# Patient Record
Sex: Male | Born: 1935 | Race: White | Hispanic: No | Marital: Married | Smoking: Former smoker
Health system: Southern US, Community
[De-identification: ages and names within clinical notes are randomized; demographics above are authoritative.]

## PROBLEM LIST (undated history)

## (undated) DIAGNOSIS — T8859XA Other complications of anesthesia, initial encounter: Secondary | ICD-10-CM

## (undated) DIAGNOSIS — Z950 Presence of cardiac pacemaker: Secondary | ICD-10-CM

## (undated) DIAGNOSIS — C61 Malignant neoplasm of prostate: Secondary | ICD-10-CM

## (undated) DIAGNOSIS — E785 Hyperlipidemia, unspecified: Secondary | ICD-10-CM

## (undated) DIAGNOSIS — I4510 Unspecified right bundle-branch block: Secondary | ICD-10-CM

## (undated) DIAGNOSIS — J449 Chronic obstructive pulmonary disease, unspecified: Secondary | ICD-10-CM

## (undated) DIAGNOSIS — E039 Hypothyroidism, unspecified: Secondary | ICD-10-CM

## (undated) DIAGNOSIS — T4145XA Adverse effect of unspecified anesthetic, initial encounter: Secondary | ICD-10-CM

## (undated) DIAGNOSIS — Z8619 Personal history of other infectious and parasitic diseases: Secondary | ICD-10-CM

## (undated) DIAGNOSIS — C4491 Basal cell carcinoma of skin, unspecified: Secondary | ICD-10-CM

## (undated) DIAGNOSIS — I10 Essential (primary) hypertension: Secondary | ICD-10-CM

## (undated) DIAGNOSIS — K649 Unspecified hemorrhoids: Secondary | ICD-10-CM

## (undated) DIAGNOSIS — Z974 Presence of external hearing-aid: Secondary | ICD-10-CM

## (undated) DIAGNOSIS — Z87442 Personal history of urinary calculi: Secondary | ICD-10-CM

## (undated) HISTORY — PX: HERNIA REPAIR: SHX51

## (undated) HISTORY — PX: PROSTATE SURGERY: SHX751

## (undated) HISTORY — PX: CATARACT EXTRACTION W/ INTRAOCULAR LENS  IMPLANT, BILATERAL: SHX1307

## (undated) HISTORY — PX: TONSILLECTOMY AND ADENOIDECTOMY: SUR1326

---

## 2005-07-09 ENCOUNTER — Inpatient Hospital Stay: Payer: Self-pay | Admitting: Cardiology

## 2005-07-09 ENCOUNTER — Other Ambulatory Visit: Payer: Self-pay

## 2005-07-10 ENCOUNTER — Other Ambulatory Visit: Payer: Self-pay

## 2005-07-11 ENCOUNTER — Other Ambulatory Visit: Payer: Self-pay

## 2008-09-04 ENCOUNTER — Ambulatory Visit: Payer: Self-pay | Admitting: Gastroenterology

## 2009-08-13 ENCOUNTER — Ambulatory Visit: Payer: Self-pay | Admitting: Ophthalmology

## 2010-05-20 ENCOUNTER — Ambulatory Visit: Payer: Self-pay | Admitting: Ophthalmology

## 2014-02-23 ENCOUNTER — Ambulatory Visit: Payer: Self-pay | Admitting: Family Medicine

## 2014-03-09 ENCOUNTER — Ambulatory Visit: Payer: Self-pay | Admitting: Family Medicine

## 2014-03-22 ENCOUNTER — Ambulatory Visit: Payer: Self-pay | Admitting: Cardiothoracic Surgery

## 2014-03-29 ENCOUNTER — Ambulatory Visit: Payer: Self-pay | Admitting: Cardiothoracic Surgery

## 2014-08-09 DIAGNOSIS — I10 Essential (primary) hypertension: Secondary | ICD-10-CM | POA: Insufficient documentation

## 2014-08-09 DIAGNOSIS — Z950 Presence of cardiac pacemaker: Secondary | ICD-10-CM | POA: Insufficient documentation

## 2014-08-09 DIAGNOSIS — E7800 Pure hypercholesterolemia, unspecified: Secondary | ICD-10-CM | POA: Insufficient documentation

## 2014-08-13 ENCOUNTER — Ambulatory Visit: Payer: Self-pay | Admitting: Cardiology

## 2014-08-16 ENCOUNTER — Ambulatory Visit: Payer: Self-pay | Admitting: Cardiology

## 2014-08-16 DIAGNOSIS — Z950 Presence of cardiac pacemaker: Secondary | ICD-10-CM

## 2014-08-16 HISTORY — DX: Presence of cardiac pacemaker: Z95.0

## 2014-08-16 HISTORY — PX: INSERT / REPLACE / REMOVE PACEMAKER: SUR710

## 2014-09-20 ENCOUNTER — Ambulatory Visit: Payer: Self-pay | Admitting: Cardiothoracic Surgery

## 2014-09-27 ENCOUNTER — Ambulatory Visit
Admit: 2014-09-27 | Disposition: A | Discharge status: Home / Self Care | Payer: Self-pay | Attending: Cardiothoracic Surgery | Admitting: Cardiothoracic Surgery

## 2014-09-28 ENCOUNTER — Ambulatory Visit
Admit: 2014-09-28 | Disposition: A | Discharge status: Home / Self Care | Payer: Self-pay | Attending: Cardiothoracic Surgery | Admitting: Cardiothoracic Surgery

## 2014-10-04 DIAGNOSIS — E039 Hypothyroidism, unspecified: Secondary | ICD-10-CM | POA: Insufficient documentation

## 2014-10-28 NOTE — Op Note (Signed)
PATIENT NAME:  Danny Phillips, Danny Phillips MR#:  [id number] DATE OF BIRTH:  1928/11/01  DATE OF PROCEDURE:  08/16/2014  PRIMARY CARE PHYSICIAN: Juluis Pitch, MD  PREPROCEDURE DIAGNOSES:  1.  Complete heart block. 2.  Elective replacement indication.   PROCEDURE: Dual chamber pacemaker generator change-out.  POSTPROCEDURE DIAGNOSIS: Atrial sensing with ventricular pacing.   INDICATION: The patient is an 81 year old gentleman who is status post dual-chamber pacemaker 07/10/2005 for complete heart block. Recent pacemaker interrogation revealed that the pacemaker generator was at elective replacement indication. The procedure, the risks, benefits, and alternatives of pacemaker generator change-out were explained to the patient and informed written consent was obtained.   DESCRIPTION OF PROCEDURE: He was brought to the operating room in a fasting state. The left pectoral region was prepped and draped in the usual sterile manner. Anesthesia was obtained with 1% Xylocaine locally. A 6 cm incision was performed over the left pectoral region. The old pacemaker generator was retrieved by electrocautery and blunt dissection. The leads were disconnected from the old pacemaker generator and connected to a new dual-chamber rate responsive pacemaker generator (Medtronic Adapta [id number]). The pacemaker pocket was irrigated with gentamicin solution. The new pacemaker generator was positioned into the pocket. The pocket was closed with 2-0 and 4-0 Vicryl, respectively. Steri-Strips and a pressure dressing were applied.   ____________________________ Isaias Cowman, MD ap:sb D: 08/16/2014 13:11:34 ET T: 08/16/2014 13:43:09 ET JOB#: [id number]  cc: Isaias Cowman, MD, <Dictator> Isaias Cowman MD ELECTRONICALLY SIGNED 08/21/2014 14:37

## 2014-11-29 DIAGNOSIS — R0681 Apnea, not elsewhere classified: Secondary | ICD-10-CM | POA: Insufficient documentation

## 2015-04-17 DIAGNOSIS — E785 Hyperlipidemia, unspecified: Secondary | ICD-10-CM | POA: Insufficient documentation

## 2015-04-17 DIAGNOSIS — K219 Gastro-esophageal reflux disease without esophagitis: Secondary | ICD-10-CM | POA: Insufficient documentation

## 2015-08-06 DIAGNOSIS — I495 Sick sinus syndrome: Secondary | ICD-10-CM | POA: Diagnosis not present

## 2015-08-08 DIAGNOSIS — L821 Other seborrheic keratosis: Secondary | ICD-10-CM | POA: Diagnosis not present

## 2015-08-08 DIAGNOSIS — L820 Inflamed seborrheic keratosis: Secondary | ICD-10-CM | POA: Diagnosis not present

## 2015-08-08 DIAGNOSIS — D229 Melanocytic nevi, unspecified: Secondary | ICD-10-CM | POA: Diagnosis not present

## 2015-08-08 DIAGNOSIS — L300 Nummular dermatitis: Secondary | ICD-10-CM | POA: Diagnosis not present

## 2015-08-08 DIAGNOSIS — L720 Epidermal cyst: Secondary | ICD-10-CM | POA: Diagnosis not present

## 2015-09-02 DIAGNOSIS — I10 Essential (primary) hypertension: Secondary | ICD-10-CM | POA: Diagnosis not present

## 2015-09-02 DIAGNOSIS — J069 Acute upper respiratory infection, unspecified: Secondary | ICD-10-CM | POA: Diagnosis not present

## 2015-09-11 DIAGNOSIS — L300 Nummular dermatitis: Secondary | ICD-10-CM | POA: Diagnosis not present

## 2015-09-11 DIAGNOSIS — L280 Lichen simplex chronicus: Secondary | ICD-10-CM | POA: Diagnosis not present

## 2015-09-16 ENCOUNTER — Telehealth: Payer: Self-pay | Admitting: Cardiothoracic Surgery

## 2015-09-16 DIAGNOSIS — R509 Fever, unspecified: Secondary | ICD-10-CM | POA: Diagnosis not present

## 2015-09-16 DIAGNOSIS — R6883 Chills (without fever): Secondary | ICD-10-CM | POA: Diagnosis not present

## 2015-09-16 DIAGNOSIS — R05 Cough: Secondary | ICD-10-CM | POA: Diagnosis not present

## 2015-09-16 DIAGNOSIS — J189 Pneumonia, unspecified organism: Secondary | ICD-10-CM | POA: Diagnosis not present

## 2015-09-16 NOTE — Telephone Encounter (Signed)
Patient has appt w/[facility] on 09[facility]. She said he usually has a chest scan prior to seeing him but they have not received any info about appt for scan. No orders currently entered. Please advise. [phone].

## 2015-09-24 DIAGNOSIS — R062 Wheezing: Secondary | ICD-10-CM | POA: Diagnosis not present

## 2015-09-26 ENCOUNTER — Inpatient Hospital Stay: Payer: PPO | Admitting: Cardiothoracic Surgery

## 2015-09-26 DIAGNOSIS — R911 Solitary pulmonary nodule: Secondary | ICD-10-CM | POA: Insufficient documentation

## 2015-09-26 DIAGNOSIS — E7800 Pure hypercholesterolemia, unspecified: Secondary | ICD-10-CM | POA: Diagnosis not present

## 2015-09-26 DIAGNOSIS — R0602 Shortness of breath: Secondary | ICD-10-CM | POA: Diagnosis not present

## 2015-09-26 DIAGNOSIS — Z950 Presence of cardiac pacemaker: Secondary | ICD-10-CM | POA: Diagnosis not present

## 2015-09-26 DIAGNOSIS — I10 Essential (primary) hypertension: Secondary | ICD-10-CM | POA: Diagnosis not present

## 2015-10-03 ENCOUNTER — Ambulatory Visit
Admission: RE | Admit: 2015-10-03 | Discharge: 2015-10-03 | Disposition: A | Discharge status: Home / Self Care | Payer: PPO | Source: Ambulatory Visit | Attending: Cardiothoracic Surgery | Admitting: Cardiothoracic Surgery

## 2015-10-03 ENCOUNTER — Inpatient Hospital Stay: Payer: PPO | Attending: Cardiothoracic Surgery | Admitting: Cardiothoracic Surgery

## 2015-10-03 VITALS — BP 145/87 | HR 78 | Temp 96.4°F | Ht 67.0 in | Wt 158.3 lb

## 2015-10-03 DIAGNOSIS — R918 Other nonspecific abnormal finding of lung field: Secondary | ICD-10-CM | POA: Diagnosis not present

## 2015-10-03 DIAGNOSIS — R05 Cough: Secondary | ICD-10-CM | POA: Insufficient documentation

## 2015-10-03 DIAGNOSIS — R911 Solitary pulmonary nodule: Secondary | ICD-10-CM | POA: Diagnosis not present

## 2015-10-03 NOTE — Progress Notes (Signed)
Danny Phillips Inpatient Post-Op Note  Patient ID: Danny Phillips., male   DOB: December 05, 1928, 82 y.o.   MRN: [id number]  HISTORY: [name] returns today in follow-up. He states that he was treated for pneumonia several weeks ago and his symptoms have now resolved. Mostly he was complaining of some cough with a small amount of sputum. A chest x-ray was obtained in March which did not reveal any obvious mass. He did have a CT scan performed today which I have independently reviewed. He has no other intercurrent problems.   Filed Vitals:   10/03/15 1123  BP: 145/87  Pulse: 78  Temp: 96.4 F (35.8 C)     EXAM: Resp: Lungs are clear bilaterally.  No respiratory distress, normal effort. Heart:  Regular without murmurs Abd:  Abdomen is soft, non distended and non tender. No masses are palpable.  There is no rebound and no guarding.  Neurological: Alert and oriented to person, place, and time. Coordination normal.  Skin: Skin is warm and dry. No rash noted. No diaphoretic. No erythema. No pallor.  Psychiatric: Normal mood and affect. Normal behavior. Judgment and thought content normal.   There is a well-healed scar in his left infraclavicular region.  ASSESSMENT: I have independently reviewed the patient's CT scan. There is a stable 6 mm nodule in the right lower lobe as well as other scattered calcified pulmonary nodules. This is likely benign disease and I would like to see the patient back again in one year for a final CT.   PLAN:   We will see the patient back again in one year with another CT scan of the chest.    Nestor Lewandowsky, MD

## 2015-10-07 NOTE — Progress Notes (Signed)
Danny Phillips Inpatient Post-Op Note  Patient ID: Danny Lamendola., male   DOB: September 29, 1928, 82 y.o.   MRN: [id number]  HISTORY: No new problems.  Not short of breath.     There were no vitals filed for this visit.   EXAM: Resp: Lungs are clear bilaterally.  No respiratory distress, normal effort. Heart:  Regular without murmurs Abd:  Abdomen is soft, non distended and non tender. No masses are palpable.  There is no rebound and no guarding.  Neurological: Alert and oriented to person, place, and time. Coordination normal.  Skin: Skin is warm and dry. No rash noted. No diaphoretic. No erythema. No pallor.  Psychiatric: Normal mood and affect. Normal behavior. Judgment and thought content normal.    ASSESSMENT: Independent review of CT of chest shows stable nodule in the RLL most likely benign as it has been stable for over a year   PLAN:   Will see in one year with a CT of chest.  Last one if nodule is stable.      Nestor Lewandowsky, MD

## 2015-10-17 DIAGNOSIS — I10 Essential (primary) hypertension: Secondary | ICD-10-CM | POA: Diagnosis not present

## 2015-10-17 DIAGNOSIS — Z0000 Encounter for general adult medical examination without abnormal findings: Secondary | ICD-10-CM | POA: Diagnosis not present

## 2015-10-17 DIAGNOSIS — E7800 Pure hypercholesterolemia, unspecified: Secondary | ICD-10-CM | POA: Diagnosis not present

## 2015-10-17 DIAGNOSIS — K219 Gastro-esophageal reflux disease without esophagitis: Secondary | ICD-10-CM | POA: Diagnosis not present

## 2015-10-17 DIAGNOSIS — E039 Hypothyroidism, unspecified: Secondary | ICD-10-CM | POA: Diagnosis not present

## 2015-10-17 DIAGNOSIS — Z8546 Personal history of malignant neoplasm of prostate: Secondary | ICD-10-CM | POA: Diagnosis not present

## 2015-11-11 DIAGNOSIS — D72829 Elevated white blood cell count, unspecified: Secondary | ICD-10-CM | POA: Diagnosis not present

## 2015-11-11 DIAGNOSIS — J441 Chronic obstructive pulmonary disease with (acute) exacerbation: Secondary | ICD-10-CM | POA: Diagnosis not present

## 2015-11-11 DIAGNOSIS — J209 Acute bronchitis, unspecified: Secondary | ICD-10-CM | POA: Diagnosis not present

## 2015-11-11 DIAGNOSIS — N289 Disorder of kidney and ureter, unspecified: Secondary | ICD-10-CM | POA: Diagnosis not present

## 2015-11-11 DIAGNOSIS — J42 Unspecified chronic bronchitis: Secondary | ICD-10-CM | POA: Diagnosis not present

## 2015-11-18 DIAGNOSIS — R0902 Hypoxemia: Secondary | ICD-10-CM | POA: Diagnosis not present

## 2015-11-18 DIAGNOSIS — J9801 Acute bronchospasm: Secondary | ICD-10-CM | POA: Diagnosis not present

## 2015-11-18 DIAGNOSIS — J449 Chronic obstructive pulmonary disease, unspecified: Secondary | ICD-10-CM | POA: Diagnosis not present

## 2015-11-18 DIAGNOSIS — R0602 Shortness of breath: Secondary | ICD-10-CM | POA: Diagnosis not present

## 2015-12-03 DIAGNOSIS — R0602 Shortness of breath: Secondary | ICD-10-CM | POA: Diagnosis not present

## 2015-12-03 DIAGNOSIS — J449 Chronic obstructive pulmonary disease, unspecified: Secondary | ICD-10-CM | POA: Diagnosis not present

## 2016-01-31 DIAGNOSIS — J0100 Acute maxillary sinusitis, unspecified: Secondary | ICD-10-CM | POA: Diagnosis not present

## 2016-02-04 DIAGNOSIS — I495 Sick sinus syndrome: Secondary | ICD-10-CM | POA: Diagnosis not present

## 2016-04-02 DIAGNOSIS — E7800 Pure hypercholesterolemia, unspecified: Secondary | ICD-10-CM | POA: Diagnosis not present

## 2016-04-02 DIAGNOSIS — Z950 Presence of cardiac pacemaker: Secondary | ICD-10-CM | POA: Diagnosis not present

## 2016-04-02 DIAGNOSIS — I10 Essential (primary) hypertension: Secondary | ICD-10-CM | POA: Diagnosis not present

## 2016-04-02 DIAGNOSIS — R0602 Shortness of breath: Secondary | ICD-10-CM | POA: Diagnosis not present

## 2016-04-06 DIAGNOSIS — R911 Solitary pulmonary nodule: Secondary | ICD-10-CM | POA: Diagnosis not present

## 2016-04-06 DIAGNOSIS — R0602 Shortness of breath: Secondary | ICD-10-CM | POA: Diagnosis not present

## 2016-04-06 DIAGNOSIS — K219 Gastro-esophageal reflux disease without esophagitis: Secondary | ICD-10-CM | POA: Diagnosis not present

## 2016-04-06 DIAGNOSIS — J310 Chronic rhinitis: Secondary | ICD-10-CM | POA: Diagnosis not present

## 2016-04-06 DIAGNOSIS — J449 Chronic obstructive pulmonary disease, unspecified: Secondary | ICD-10-CM | POA: Diagnosis not present

## 2016-04-17 DIAGNOSIS — E039 Hypothyroidism, unspecified: Secondary | ICD-10-CM | POA: Diagnosis not present

## 2016-04-17 DIAGNOSIS — I10 Essential (primary) hypertension: Secondary | ICD-10-CM | POA: Diagnosis not present

## 2016-04-17 DIAGNOSIS — R0602 Shortness of breath: Secondary | ICD-10-CM | POA: Diagnosis not present

## 2016-04-17 DIAGNOSIS — J42 Unspecified chronic bronchitis: Secondary | ICD-10-CM | POA: Insufficient documentation

## 2016-04-17 DIAGNOSIS — K219 Gastro-esophageal reflux disease without esophagitis: Secondary | ICD-10-CM | POA: Diagnosis not present

## 2016-04-17 DIAGNOSIS — I499 Cardiac arrhythmia, unspecified: Secondary | ICD-10-CM | POA: Diagnosis not present

## 2016-04-17 DIAGNOSIS — E7800 Pure hypercholesterolemia, unspecified: Secondary | ICD-10-CM | POA: Diagnosis not present

## 2016-04-20 DIAGNOSIS — R0602 Shortness of breath: Secondary | ICD-10-CM | POA: Diagnosis not present

## 2016-06-08 DIAGNOSIS — J320 Chronic maxillary sinusitis: Secondary | ICD-10-CM | POA: Diagnosis not present

## 2016-06-30 DIAGNOSIS — J329 Chronic sinusitis, unspecified: Secondary | ICD-10-CM | POA: Diagnosis not present

## 2016-06-30 DIAGNOSIS — J309 Allergic rhinitis, unspecified: Secondary | ICD-10-CM | POA: Diagnosis not present

## 2016-07-22 DIAGNOSIS — R51 Headache: Secondary | ICD-10-CM | POA: Diagnosis not present

## 2016-07-22 DIAGNOSIS — J324 Chronic pansinusitis: Secondary | ICD-10-CM | POA: Diagnosis not present

## 2016-08-04 DIAGNOSIS — I495 Sick sinus syndrome: Secondary | ICD-10-CM | POA: Diagnosis not present

## 2016-09-07 DIAGNOSIS — R05 Cough: Secondary | ICD-10-CM | POA: Diagnosis not present

## 2016-09-07 DIAGNOSIS — R0602 Shortness of breath: Secondary | ICD-10-CM | POA: Diagnosis not present

## 2016-09-07 DIAGNOSIS — R911 Solitary pulmonary nodule: Secondary | ICD-10-CM | POA: Diagnosis not present

## 2016-09-16 DIAGNOSIS — R51 Headache: Secondary | ICD-10-CM | POA: Diagnosis not present

## 2016-09-16 DIAGNOSIS — J324 Chronic pansinusitis: Secondary | ICD-10-CM | POA: Diagnosis not present

## 2016-09-16 DIAGNOSIS — J309 Allergic rhinitis, unspecified: Secondary | ICD-10-CM | POA: Diagnosis not present

## 2016-09-29 ENCOUNTER — Telehealth: Payer: Self-pay

## 2016-09-29 DIAGNOSIS — E7800 Pure hypercholesterolemia, unspecified: Secondary | ICD-10-CM | POA: Diagnosis not present

## 2016-09-29 DIAGNOSIS — I10 Essential (primary) hypertension: Secondary | ICD-10-CM | POA: Diagnosis not present

## 2016-09-29 DIAGNOSIS — R0602 Shortness of breath: Secondary | ICD-10-CM | POA: Diagnosis not present

## 2016-09-29 DIAGNOSIS — Z950 Presence of cardiac pacemaker: Secondary | ICD-10-CM | POA: Diagnosis not present

## 2016-09-29 NOTE — Telephone Encounter (Signed)
Monica from [employer] [facility] called said she received two request for a prior auth. one was sent over as a STAT other was sent on fax Brayton Layman was asking if it was for additional clinic or just another request. Brayton Layman can be reached at [phone].

## 2016-09-29 NOTE — Telephone Encounter (Signed)
I spoke with [facility] at this time. She stated she received two request for the same CT Chest and I told her to cancel my request due to the stat order placed from hospital. I will call tomorrow to get authorization number for our records.

## 2016-09-29 NOTE — Telephone Encounter (Signed)
Authorization faxed to [facility] for authorization of CT chest at this time.Fax Number- [phone]

## 2016-09-29 NOTE — Telephone Encounter (Signed)
Patient is having a CT scan on 10/01/16. The CT requires authorization and needs to be authorized today by 2:00 PM.   If you have any questions you can contact Tillie Rung at [facility] P#: [phone] ext [phone]

## 2016-09-30 ENCOUNTER — Telehealth: Payer: Self-pay

## 2016-09-30 ENCOUNTER — Other Ambulatory Visit: Payer: Self-pay

## 2016-09-30 NOTE — Telephone Encounter (Signed)
Spoke with Paulita Cradle @ [place] and CPT code [phone] is approved- Authorization [id number].  Please see previous notes/calls regarding request for CT scan.

## 2016-10-01 ENCOUNTER — Ambulatory Visit: Payer: PPO | Admitting: Cardiothoracic Surgery

## 2016-10-01 ENCOUNTER — Ambulatory Visit
Admission: RE | Admit: 2016-10-01 | Discharge: 2016-10-01 | Disposition: A | Discharge status: Home / Self Care | Payer: PPO | Source: Ambulatory Visit | Attending: Cardiothoracic Surgery | Admitting: Cardiothoracic Surgery

## 2016-10-01 ENCOUNTER — Other Ambulatory Visit: Payer: Self-pay

## 2016-10-01 DIAGNOSIS — R918 Other nonspecific abnormal finding of lung field: Secondary | ICD-10-CM | POA: Diagnosis not present

## 2016-10-01 DIAGNOSIS — D71 Functional disorders of polymorphonuclear neutrophils: Secondary | ICD-10-CM | POA: Diagnosis not present

## 2016-10-01 DIAGNOSIS — I700 Atherosclerosis of aorta: Secondary | ICD-10-CM | POA: Diagnosis not present

## 2016-10-01 DIAGNOSIS — I2510 Atherosclerotic heart disease of native coronary artery without angina pectoris: Secondary | ICD-10-CM | POA: Diagnosis not present

## 2016-10-01 DIAGNOSIS — R911 Solitary pulmonary nodule: Secondary | ICD-10-CM | POA: Diagnosis not present

## 2016-10-02 ENCOUNTER — Encounter: Payer: Self-pay | Admitting: Cardiothoracic Surgery

## 2016-10-02 ENCOUNTER — Ambulatory Visit (INDEPENDENT_AMBULATORY_CARE_PROVIDER_SITE_OTHER): Payer: PPO | Admitting: Cardiothoracic Surgery

## 2016-10-02 ENCOUNTER — Encounter: Payer: Self-pay | Admitting: *Deleted

## 2016-10-02 VITALS — BP 159/60 | HR 84 | Temp 97.0°F | Resp 15 | Ht 67.0 in | Wt 156.0 lb

## 2016-10-02 DIAGNOSIS — R911 Solitary pulmonary nodule: Secondary | ICD-10-CM | POA: Diagnosis not present

## 2016-10-02 NOTE — Patient Instructions (Signed)
Please give us a call if you have any questions or concerns. 

## 2016-10-02 NOTE — Progress Notes (Signed)
  Patient ID: Danny BURROUGHS Sr., male   DOB: Jan 19, 1929, 83 y.o.   MRN: [id number]  HISTORY: This gentleman returns today in follow-up. He has a small 4-5 mm nodule in the right upper lobe that we have been following since early 2016. His only recent complaint is that he's developed significant sinusitis and has been worked up extensively by Dr. Tami Ribas. He is about to undergo nasal and sinus surgery next week. He has no new complaints relative to his lungs. He does have a pacemaker in place and as being managed by Dr. Saralyn Pilar.   Vitals:   10/02/16 1042  BP: (!) 159/60  Pulse: 84  Resp: 15  Temp: 97 F (36.1 C)     EXAM:    Resp: Lungs are clear bilaterally.  No respiratory distress, normal effort. Heart:  Regular without murmurs Abd:  Abdomen is soft, non distended and non tender. No masses are palpable.  There is no rebound and no guarding.  Neurological: Alert and oriented to person, place, and time. Coordination normal.  Skin: Skin is warm and dry. No rash noted. No diaphoretic. No erythema. No pallor.  Psychiatric: Normal mood and affect. Normal behavior. Judgment and thought content normal.    ASSESSMENT: I have independently reviewed the patient's CT scan. There are small pulmonary nodules present. These are unchanged over 2 years. He does have a sebaceous cyst on his anterior chest wall which has been present for many years but does appear slightly larger. He is currently asymptomatic with that.   PLAN:   I did not see any further need to monitor the lung nodule. He will follow-up with Dr. Tami Ribas next week for sinus surgery. He will follow-up with his primary care physician as needed. We did not make a return visit for him but I would be happy to see him should the need arise.    Nestor Lewandowsky, MD

## 2016-10-05 ENCOUNTER — Encounter: Payer: Self-pay | Admitting: *Deleted

## 2016-10-05 NOTE — Discharge Instructions (Signed)
[facility] °[facility] °ENDOSCOPIC SINUS SURGERY °[facility] ° °What is Functional Endoscopic Sinus Surgery? ° The Surgery involves making the natural openings of the sinuses larger by removing the bony partitions that separate the sinuses from the nasal cavity.  The natural sinus lining is preserved as much as possible to allow the sinuses to resume normal function after the surgery.  In some patients nasal polyps (excessively swollen lining of the sinuses) may be removed to relieve obstruction of the sinus openings.  The surgery is performed through the nose using lighted scopes, which eliminates the need for incisions on the face.  A septoplasty is a different procedure which is sometimes performed with sinus surgery.  It involves straightening the boy partition that separates the two sides of your nose.  A crooked or deviated septum may need repair if is obstructing the sinuses or nasal airflow.  Turbinate reduction is also often performed during sinus surgery.  The turbinates are bony proturberances from the side walls of the nose which swell and can obstruct the nose in patients with sinus and allergy problems.  Their size can be surgically reduced to help relieve nasal obstruction. ° °What Can Sinus Surgery Do For Me? ° Sinus surgery can reduce the frequency of sinus infections requiring antibiotic treatment.  This can provide improvement in nasal congestion, post-nasal drainage, facial pressure and nasal obstruction.  Surgery will NOT prevent you from ever having an infection again, so it usually only for patients who get infections 4 or more times yearly requiring antibiotics, or for infections that do not clear with antibiotics.  It will not cure nasal allergies, so patients with allergies may still require medication to treat their allergies after surgery. Surgery may improve headaches related to sinusitis, however, some people will continue to  require medication to control sinus headaches related to allergies.  Surgery will do nothing for other forms of headache (migraine, tension or cluster). ° °What Are the Risks of Endoscopic Sinus Surgery? ° Current techniques allow surgery to be performed safely with little risk, however, there are rare complications that patients should be aware of.  Because the sinuses are located around the eyes, there is risk of eye injury, including blindness, though again, this would be quite rare. This is usually a result of bleeding behind the eye during surgery, which puts the vision oat risk, though there are treatments to protect the vision and prevent permanent disrupted by surgery causing a leak of the spinal fluid that surrounds the brain.  More serious complications would include bleeding inside the brain cavity or damage to the brain.  Again, all of these complications are uncommon, and spinal fluid leaks can be safely managed surgically if they occur.  The most common complication of sinus surgery is bleeding from the nose, which may require packing or cauterization of the nose.  Continued sinus have polyps may experience recurrence of the polyps requiring revision surgery.  Alterations of sense of smell or injury to the tear ducts are also rare complications.  ° °What is the Surgery Like, and what is the Recovery? ° The Surgery usually takes a couple of hours to perform, and is usually performed under a general anesthetic (completely asleep).  Patients are usually discharged home after a couple of hours.  Sometimes during surgery it is necessary to pack the nose to control bleeding, and the packing is left in place for 24 - 48 hours, and removed by your surgeon.    If a septoplasty was performed during the procedure, there is often a splint placed which must be removed after 5-7 days.   °Discomfort: Pain is usually mild to moderate, and can be controlled by prescription pain medication or acetaminophen (Tylenol).   Aspirin, Ibuprofen (Advil, Motrin), or Naprosyn (Aleve) should be avoided, as they can cause increased bleeding.  Most patients feel sinus pressure like they have a bad head cold for several days.  Sleeping with your head elevated can help reduce swelling and facial pressure, as can ice packs over the face.  A humidifier may be helpful to keep the mucous and blood from drying in the nose.  ° °Diet: There are no specific diet restrictions, however, you should generally start with clear liquids and a light diet of bland foods because the anesthetic can cause some nausea.  Advance your diet depending on how your stomach feels.  Taking your pain medication with food will often help reduce stomach upset which pain medications can cause. ° °Nasal Saline Irrigation: It is important to remove blood clots and dried mucous from the nose as it is healing.  This is done by having you irrigate the nose at least 3 - 4 times daily with a salt water solution.  We recommend using NeilMed Sinus Rinse (available at the drug store).  Fill the squeeze bottle with the solution, bend over a sink, and insert the tip of the squeeze bottle into the nose ½ of an inch.  Point the tip of the squeeze bottle towards the inside corner of the eye on the same side your irrigating.  Squeeze the bottle and gently irrigate the nose.  If you bend forward as you do this, most of the fluid will flow back out of the nose, instead of down your throat.   The solution should be warm, near body temperature, when you irrigate.   Each time you irrigate, you should use a full squeeze bottle.  ° °Note that if you are instructed to use Nasal Steroid Sprays at any time after your surgery, irrigate with saline BEFORE using the steroid spray, so you do not wash it all out of the nose. °Another product, Nasal Saline Gel (such as AYR Nasal Saline Gel) can be applied in each nostril 3 - 4 times daily to moisture the nose and reduce scabbing or crusting. ° °Bleeding:   Bloody drainage from the nose can be expected for several days, and patients are instructed to irrigate their nose frequently with salt water to help remove mucous and blood clots.  The drainage may be dark red or brown, though some fresh blood may be seen intermittently, especially after irrigation.  Do not blow you nose, as bleeding may occur. If you must sneeze, keep your mouth open to allow air to escape through your mouth. ° °If heavy bleeding occurs: Irrigate the nose with saline to rinse out clots, then spray the nose 3 - 4 times with Afrin Nasal Decongestant Spray.  The spray will constrict the blood vessels to slow bleeding.  Pinch the lower half of your nose shut to apply pressure, and lay down with your head elevated.  Ice packs over the nose may help as well. If bleeding persists despite these measures, you should notify your doctor.  Do not use the Afrin routinely to control nasal congestion after surgery, as it can result in worsening congestion and may affect healing.  ° °Activity: Return to work varies among patients. Most patients will be out of   work at least 5 - 7 days to recover.  Patient may return to work after they are off of narcotic pain medication, and feeling well enough to perform the functions of their job.  Patients must avoid heavy lifting (over 10 pounds) or strenuous physical for 2 weeks after surgery, so your employer may need to assign you to light duty, or keep you out of work longer if light duty is not possible.  NOTE: you should not drive, operate dangerous machinery, do any mentally demanding tasks or make any important legal or financial decisions while on narcotic pain medication and recovering from the general anesthetic.  °  °Call Your Doctor Immediately if You Have Any of the Following: °1. Bleeding that you cannot control with the above measures °2. Loss of vision, double vision, bulging of the eye or black eyes. °3. Fever over 101 degrees °4. Neck stiffness with severe  headache, fever, nausea and change in mental state. °You are always encourage to call anytime with concerns, however, please call with requests for pain medication refills during office hours. ° °Office Endoscopy: During follow-up visits your doctor will remove any packing or splints that may have been placed and evaluate and clean your sinuses endoscopically.  Topical anesthetic will be used to make this as comfortable as possible, though you may want to take your pain medication prior to the visit.  How often this will need to be done varies from patient to patient.  After complete recovery from the surgery, you may need follow-up endoscopy from time to time, particularly if there is concern of recurrent infection or nasal polyps. ° ° °General Anesthesia, Adult, Care After °These instructions provide you with information about caring for yourself after your procedure. Your health care provider may also give you more specific instructions. Your treatment has been planned according to current medical practices, but problems sometimes occur. Call your health care provider if you have any problems or questions after your procedure. °What can I expect after the procedure? °After the procedure, it is common to have: °· Vomiting. °· A sore throat. °· Mental slowness. °It is common to feel: °· Nauseous. °· Cold or shivery. °· Sleepy. °· Tired. °· Sore or achy, even in parts of your body where you did not have surgery. °Follow these instructions at home: °For at least 24 hours after the procedure: °· Do not: °¨ Participate in activities where you could fall or become injured. °¨ Drive. °¨ Use heavy machinery. °¨ Drink alcohol. °¨ Take sleeping pills or medicines that cause drowsiness. °¨ Make important decisions or sign legal documents. °¨ Take care of children on your own. °· Rest. °Eating and drinking °· If you vomit, drink water, juice, or soup when you can drink without vomiting. °· Drink enough fluid to keep your  urine clear or pale yellow. °· Make sure you have little or no nausea before eating solid foods. °· Follow the diet recommended by your health care provider. °General instructions °· Have a responsible adult stay with you until you are awake and alert. °· Return to your normal activities as told by your health care provider. Ask your health care provider what activities are safe for you. °· Take over-the-counter and prescription medicines only as told by your health care provider. °· If you smoke, do not smoke without supervision. °· Keep all follow-up visits as told by your health care provider. This is important. °Contact a health care provider if: °· You continue to have nausea   or vomiting at home, and medicines are not helpful. °· You cannot drink fluids or start eating again. °· You cannot urinate after 8-12 hours. °· You develop a skin rash. °· You have fever. °· You have increasing redness at the site of your procedure. °Get help right away if: °· You have difficulty breathing. °· You have chest pain. °· You have unexpected bleeding. °· You feel that you are having a life-threatening or urgent problem. °This information is not intended to replace advice given to you by your health care provider. Make sure you discuss any questions you have with your health care provider. °Document Released: 09/21/2000 Document Revised: 11/18/2015 Document Reviewed: 05/30/2015 °Elsevier Interactive Patient Education © 2017 [employer] ° °

## 2016-10-09 ENCOUNTER — Ambulatory Visit
Admission: RE | Admit: 2016-10-09 | Discharge: 2016-10-09 | Disposition: A | Discharge status: Home / Self Care | Payer: PPO | Source: Ambulatory Visit | Attending: Unknown Physician Specialty | Admitting: Unknown Physician Specialty

## 2016-10-09 ENCOUNTER — Ambulatory Visit: Payer: PPO | Admitting: Student in an Organized Health Care Education/Training Program

## 2016-10-09 ENCOUNTER — Encounter
Admission: RE | Disposition: A | Discharge status: Home / Self Care | Payer: Self-pay | Source: Ambulatory Visit | Attending: Unknown Physician Specialty

## 2016-10-09 DIAGNOSIS — E039 Hypothyroidism, unspecified: Secondary | ICD-10-CM | POA: Insufficient documentation

## 2016-10-09 DIAGNOSIS — I442 Atrioventricular block, complete: Secondary | ICD-10-CM | POA: Insufficient documentation

## 2016-10-09 DIAGNOSIS — J322 Chronic ethmoidal sinusitis: Secondary | ICD-10-CM | POA: Diagnosis not present

## 2016-10-09 DIAGNOSIS — J321 Chronic frontal sinusitis: Secondary | ICD-10-CM | POA: Diagnosis not present

## 2016-10-09 DIAGNOSIS — J324 Chronic pansinusitis: Secondary | ICD-10-CM | POA: Diagnosis not present

## 2016-10-09 DIAGNOSIS — I10 Essential (primary) hypertension: Secondary | ICD-10-CM | POA: Insufficient documentation

## 2016-10-09 DIAGNOSIS — J449 Chronic obstructive pulmonary disease, unspecified: Secondary | ICD-10-CM | POA: Insufficient documentation

## 2016-10-09 DIAGNOSIS — Z87891 Personal history of nicotine dependence: Secondary | ICD-10-CM | POA: Diagnosis not present

## 2016-10-09 DIAGNOSIS — G8929 Other chronic pain: Secondary | ICD-10-CM | POA: Diagnosis not present

## 2016-10-09 DIAGNOSIS — J343 Hypertrophy of nasal turbinates: Secondary | ICD-10-CM | POA: Insufficient documentation

## 2016-10-09 DIAGNOSIS — Z79899 Other long term (current) drug therapy: Secondary | ICD-10-CM | POA: Insufficient documentation

## 2016-10-09 DIAGNOSIS — K219 Gastro-esophageal reflux disease without esophagitis: Secondary | ICD-10-CM | POA: Diagnosis not present

## 2016-10-09 DIAGNOSIS — J328 Other chronic sinusitis: Secondary | ICD-10-CM | POA: Diagnosis not present

## 2016-10-09 DIAGNOSIS — J309 Allergic rhinitis, unspecified: Secondary | ICD-10-CM | POA: Diagnosis not present

## 2016-10-09 DIAGNOSIS — J338 Other polyp of sinus: Secondary | ICD-10-CM | POA: Diagnosis not present

## 2016-10-09 DIAGNOSIS — Z7951 Long term (current) use of inhaled steroids: Secondary | ICD-10-CM | POA: Diagnosis not present

## 2016-10-09 DIAGNOSIS — Z950 Presence of cardiac pacemaker: Secondary | ICD-10-CM | POA: Insufficient documentation

## 2016-10-09 DIAGNOSIS — R51 Headache: Secondary | ICD-10-CM | POA: Diagnosis not present

## 2016-10-09 DIAGNOSIS — J329 Chronic sinusitis, unspecified: Secondary | ICD-10-CM | POA: Diagnosis not present

## 2016-10-09 DIAGNOSIS — J323 Chronic sphenoidal sinusitis: Secondary | ICD-10-CM | POA: Diagnosis not present

## 2016-10-09 DIAGNOSIS — J320 Chronic maxillary sinusitis: Secondary | ICD-10-CM | POA: Diagnosis not present

## 2016-10-09 HISTORY — DX: Personal history of urinary calculi: Z87.442

## 2016-10-09 HISTORY — DX: Other complications of anesthesia, initial encounter: T88.59XA

## 2016-10-09 HISTORY — DX: Adverse effect of unspecified anesthetic, initial encounter: T41.45XA

## 2016-10-09 HISTORY — DX: Unspecified hemorrhoids: K64.9

## 2016-10-09 HISTORY — PX: FRONTAL SINUS EXPLORATION: SHX6591

## 2016-10-09 HISTORY — DX: Presence of cardiac pacemaker: Z95.0

## 2016-10-09 HISTORY — PX: SPHENOIDECTOMY: SHX2421

## 2016-10-09 HISTORY — DX: Hypothyroidism, unspecified: E03.9

## 2016-10-09 HISTORY — PX: ETHMOIDECTOMY: SHX5197

## 2016-10-09 HISTORY — DX: Essential (primary) hypertension: I10

## 2016-10-09 HISTORY — PX: NASAL TURBINATE REDUCTION: SHX2072

## 2016-10-09 HISTORY — DX: Presence of external hearing-aid: Z97.4

## 2016-10-09 HISTORY — DX: Hyperlipidemia, unspecified: E78.5

## 2016-10-09 HISTORY — DX: Basal cell carcinoma of skin, unspecified: C44.91

## 2016-10-09 HISTORY — DX: Malignant neoplasm of prostate: C61

## 2016-10-09 HISTORY — DX: Chronic obstructive pulmonary disease, unspecified: J44.9

## 2016-10-09 HISTORY — PX: IMAGE GUIDED SINUS SURGERY: SHX6570

## 2016-10-09 HISTORY — DX: Unspecified right bundle-branch block: I45.10

## 2016-10-09 HISTORY — DX: Personal history of other infectious and parasitic diseases: Z86.19

## 2016-10-09 HISTORY — PX: MAXILLARY ANTROSTOMY: SHX2003

## 2016-10-09 SURGERY — SINUS SURGERY, WITH IMAGING GUIDANCE
Anesthesia: General | Site: Nose | Wound class: Dirty or Infected

## 2016-10-09 MED ORDER — ACETAMINOPHEN 10 MG/ML IV SOLN
1000.0000 mg | Freq: Once | INTRAVENOUS | Status: AC
  Administered 2016-10-09: 1000 mg via INTRAVENOUS

## 2016-10-09 MED ORDER — ONDANSETRON HCL 4 MG/2ML IJ SOLN: 4.0000 mg | Freq: Once | INTRAMUSCULAR | Status: DC | PRN

## 2016-10-09 MED ORDER — LACTATED RINGERS IV SOLN
INTRAVENOUS | Status: DC
  Administered 2016-10-09: 08:00:00 via INTRAVENOUS

## 2016-10-09 MED ORDER — ONDANSETRON HCL 4 MG/2ML IJ SOLN: INTRAMUSCULAR | Status: DC | PRN

## 2016-10-09 MED ORDER — ONDANSETRON HCL 4 MG/2ML IJ SOLN
INTRAMUSCULAR | Status: DC | PRN
  Administered 2016-10-09: 4 mg via INTRAVENOUS

## 2016-10-09 MED ORDER — OXYCODONE-ACETAMINOPHEN 5-325 MG PO TABS: 1.0000 | ORAL_TABLET | ORAL | 0 refills | Status: DC | PRN

## 2016-10-09 MED ORDER — OXYCODONE HCL 5 MG PO TABS
5.0000 mg | ORAL_TABLET | Freq: Once | ORAL | Status: AC | PRN
  Administered 2016-10-09: 5 mg via ORAL

## 2016-10-09 MED ORDER — LIDOCAINE-EPINEPHRINE 1 %-1:100000 IJ SOLN
INTRAMUSCULAR | Status: DC | PRN
  Administered 2016-10-09: 12 mL

## 2016-10-09 MED ORDER — FENTANYL CITRATE (PF) 100 MCG/2ML IJ SOLN
INTRAMUSCULAR | Status: DC | PRN
  Administered 2016-10-09 (×2): 50 ug via INTRAVENOUS

## 2016-10-09 MED ORDER — LIDOCAINE HCL (CARDIAC) 20 MG/ML IV SOLN
INTRAVENOUS | Status: DC | PRN
  Administered 2016-10-09: 50 mg via INTRAVENOUS

## 2016-10-09 MED ORDER — PROPOFOL 10 MG/ML IV BOLUS
INTRAVENOUS | Status: DC | PRN
  Administered 2016-10-09: 50 mg via INTRAVENOUS

## 2016-10-09 MED ORDER — LIDOCAINE HCL 1 % IJ SOLN
INTRAMUSCULAR | Status: DC | PRN
  Administered 2016-10-09: 30 mL via TOPICAL

## 2016-10-09 MED ORDER — FENTANYL CITRATE (PF) 100 MCG/2ML IJ SOLN: 25.0000 ug | INTRAMUSCULAR | Status: DC | PRN

## 2016-10-09 MED ORDER — OXYMETAZOLINE HCL 0.05 % NA SOLN
6.0000 | Freq: Once | NASAL | Status: AC
  Administered 2016-10-09: 6 via NASAL

## 2016-10-09 MED ORDER — MIDAZOLAM HCL 5 MG/5ML IJ SOLN
INTRAMUSCULAR | Status: DC | PRN
  Administered 2016-10-09 (×2): 1 mg via INTRAVENOUS

## 2016-10-09 MED ORDER — PHENYLEPHRINE HCL 10 MG/ML IJ SOLN
INTRAMUSCULAR | Status: DC | PRN
  Administered 2016-10-09: 50 ug via INTRAVENOUS

## 2016-10-09 MED ORDER — OXYCODONE HCL 5 MG/5ML PO SOLN
5.0000 mg | Freq: Once | ORAL | Status: AC | PRN
Start: 2016-10-09 — End: 2016-10-09

## 2016-10-09 MED ORDER — DEXAMETHASONE SODIUM PHOSPHATE 4 MG/ML IJ SOLN
INTRAMUSCULAR | Status: DC | PRN
  Administered 2016-10-09: 10 mg via INTRAVENOUS

## 2016-10-09 MED ORDER — SULFAMETHOXAZOLE-TRIMETHOPRIM 800-160 MG PO TABS: 1.0000 | ORAL_TABLET | Freq: Two times a day (BID) | ORAL | 0 refills | Status: DC

## 2016-10-09 MED ORDER — ROCURONIUM BROMIDE 100 MG/10ML IV SOLN
INTRAVENOUS | Status: DC | PRN
  Administered 2016-10-09: 30 mg via INTRAVENOUS

## 2016-10-09 SURGICAL SUPPLY — 32 items
BATTERY INSTRU NAVIGATION (MISCELLANEOUS) ×20 IMPLANT
CANISTER SUCT 1200ML W/VALVE (MISCELLANEOUS) ×5 IMPLANT
COAG SUCT 10F 3.5MM HAND CTRL (MISCELLANEOUS) ×5 IMPLANT
CUP MEDICINE 2OZ PLAST GRAD ST (MISCELLANEOUS) ×5 IMPLANT
DRAPE HEAD BAR (DRAPES) ×5 IMPLANT
DRESSING NASL FOAM PST OP SINU (MISCELLANEOUS) ×6 IMPLANT
DRSG NASAL FOAM POST OP SINU (MISCELLANEOUS) ×10
GLOVE BIO SURGEON STRL SZ7.5 (GLOVE) ×10 IMPLANT
HANDLE YANKAUER SUCT BULB TIP (MISCELLANEOUS) ×5 IMPLANT
KIT ROOM TURNOVER OR (KITS) ×5 IMPLANT
NAVIGATION MASK REG  ST (MISCELLANEOUS) ×5 IMPLANT
NEEDLE HYPO 25GX1X1/2 BEV (NEEDLE) ×5 IMPLANT
NS IRRIG 500ML POUR BTL (IV SOLUTION) ×5 IMPLANT
PACK DRAPE NASAL/ENT (PACKS) ×5 IMPLANT
PAD GROUND ADULT SPLIT (MISCELLANEOUS) ×5 IMPLANT
SOL ANTI-FOG 6CC FOG-OUT (MISCELLANEOUS) ×3 IMPLANT
SOL FOG-OUT ANTI-FOG 6CC (MISCELLANEOUS) ×2
SPLINT NASAL SEPTAL BLV .25 LG (MISCELLANEOUS) IMPLANT
SPLINT NASAL SEPTAL BLV .50 ST (MISCELLANEOUS) IMPLANT
SPONGE NEURO XRAY DETECT 1X3 (DISPOSABLE) ×5 IMPLANT
STRAP BODY AND KNEE 60X3 (MISCELLANEOUS) ×5 IMPLANT
SUT CHROMIC 3-0 (SUTURE) ×2
SUT CHROMIC 3-0 KS 27XMFL CR (SUTURE) ×3
SUT CHROMIC 5-0 (SUTURE)
SUT CHROMIC 5-0 P2 18XMFL CR (SUTURE)
SUT ETHILON 3-0 KS 30 BLK (SUTURE) ×5 IMPLANT
SUT PLAIN GUT 4-0 (SUTURE) IMPLANT
SUTURE CHRMC 3-0 KS 27XMFL CR (SUTURE) ×3 IMPLANT
SUTURE CHRMC 5-0 P2 18XMF CR (SUTURE) IMPLANT
SYRINGE 10CC LL (SYRINGE) ×5 IMPLANT
TOWEL OR 17X26 4PK STRL BLUE (TOWEL DISPOSABLE) ×5 IMPLANT
WATER STERILE IRR 250ML POUR (IV SOLUTION) ×5 IMPLANT

## 2016-10-09 NOTE — Anesthesia Preprocedure Evaluation (Signed)
Anesthesia Evaluation  Patient identified by MRN, date of birth, ID band Patient awake    Reviewed: Allergy & Precautions, H&P , NPO status   Airway Mallampati: II  TM Distance: >3 FB     Dental  (+) Upper Dentures, Lower Dentures   Pulmonary COPD, former smoker,    breath sounds clear to auscultation       Cardiovascular hypertension, (-) dysrhythmias + pacemaker  Rhythm:Regular Rate:Normal  Complete heart block  Per [employer]: Magnet to pacer for Asyncronous pacing at 85 bpm. If remains at 85, remove magnet at end of case and no need to interogate. If magnet-induced asyncronous paceing rate changes to 65 prior to removing magnet, contact [employer] to have pacer interogated and reprogramed.   Neuro/Psych    GI/Hepatic Neg liver ROS, GERD  ,  Endo/Other  Hypothyroidism   Renal/GU      Musculoskeletal   Abdominal   Peds  Hematology negative hematology ROS (+)   Anesthesia Other Findings   Reproductive/Obstetrics                           Anesthesia Physical Anesthesia Plan  ASA: III  Anesthesia Plan: General ETT   Post-op Pain Management:    Induction:   Airway Management Planned:   Additional Equipment:   Intra-op Plan:   Post-operative Plan:   Informed Consent: I have reviewed the patients History and Physical, chart, labs and discussed the procedure including the risks, benefits and alternatives for the proposed anesthesia with the patient or authorized representative who has indicated his/her understanding and acceptance.     Plan Discussed with: CRNA  Anesthesia Plan Comments:         Anesthesia Quick Evaluation

## 2016-10-09 NOTE — Transfer of Care (Signed)
Immediate Anesthesia Transfer of Care Note  Patient: Danny CHESTNUTT Sr.  Procedure(s) Performed: Procedure(s) with comments: IMAGE GUIDED SINUS SURGERY (N/A) - patient has a pacemaker need stryker disk GAVE DISK TO CECE 3-27 KP ETHMOIDECTOMY (Bilateral) MAXILLARY ANTROSTOMY (Bilateral) FRONTAL SINUS EXPLORATION (Bilateral) SPHENOIDECTOMY (Bilateral) TURBINATE REDUCTION/SUBMUCOSAL RESECTION (Bilateral)  Patient Location: PACU  Anesthesia Type: General ETT  Level of Consciousness: awake, alert  and patient cooperative  Airway and Oxygen Therapy: Patient Spontanous Breathing and Patient connected to supplemental oxygen  Post-op Assessment: Post-op Vital signs reviewed, Patient's Cardiovascular Status Stable, Respiratory Function Stable, Patent Airway and No signs of Nausea or vomiting  Post-op Vital Signs: Reviewed and stable  Complications: No apparent anesthesia complications

## 2016-10-09 NOTE — Anesthesia Procedure Notes (Signed)
Procedure Name: Intubation Date/Time: 10/09/2016 9:09 AM Performed by: Londell Moh Pre-anesthesia Checklist: Patient identified, Emergency Drugs available, Suction available, Patient being monitored and Timeout performed Patient Re-evaluated:Patient Re-evaluated prior to inductionOxygen Delivery Method: Circle system utilized Preoxygenation: Pre-oxygenation with 100% oxygen Intubation Type: IV induction Ventilation: Mask ventilation without difficulty Laryngoscope Size: Mac and 3 Grade View: Grade I Tube type: Oral Rae Tube size: 7.5 mm Number of attempts: 1 Placement Confirmation: ETT inserted through vocal cords under direct vision,  positive ETCO2 and breath sounds checked- equal and bilateral Tube secured with: Tape Dental Injury: Teeth and Oropharynx as per pre-operative assessment

## 2016-10-09 NOTE — H&P (Signed)
The patient's history has been reviewed, patient examined, no change in status, stable for surgery.  Questions were answered to the patients satisfaction.  

## 2016-10-09 NOTE — Op Note (Signed)
10/09/2016  10:26 AM    Danny Phillips  [id number]   Pre-Op Dx: chronic pain sinusitis  Post-op Dx: SAME  Proc: 1 use of Stryker navigation system #2 bilateral endoscopic anterior posterior ethmoidectomy with removal of tissue #3 bilateral endoscopic maxillary antrostomy with removal of tissue #4 bilateral frontal sinusotomy with removal of tissue #5 bilateral sphenoid sinusotomy with removal of tissue #6 bilateral submucous resection inferior turbinates   Surg:  Danny Phillips  Anes:  GOT  EBL:  30 cc  Comp:  None  Findings:  Thickened polypoid disease throughout the maxillary ethmoid frontal and sphenoid sinuses thickened crusty mucus within the maxillary and ethmoid sinuses bilateral inferior turbinate hypertrophy  Procedure: Danny Phillips was identified in the holding area taken the operating room placed in supine position. After Phillips endotracheal anesthesia the table was turned 90 the Stryker navigation device was applied and calibrated and remained on throughout the procedure. The patient was then draped usual fashion for sinus surgery cottonoid pledgets with phenylephrine lidocaine solution were placed within each nostril. After 5 minutes these were removed. A local anesthetic of 1% lidocaine with 1 100,000 units of epinephrine were used to inject the nasal septum inferior turbinates and lateral nasal wall a total of 10 cc was used. With this completed the 0 endoscope was brought into the field examination the nose showed a small spur on the inferior septum on the right however this was nonobstructing and the sinus cavities could easily be accessed so I did not feel septoplasty was needed. Beginning on the left-hand side the middle turbinate was gently medialized there was pus coming from the ethmoid region this was sent for culture. Using the Stryker navigation device the uncinate process was identified this was taken down using the Cottle elevator and the maxillary sinus was  entered. There was thickened crusty inspissated mucus was suctioned free and sent for culture. The maxillary sinus was widely opened using the straight and side biting forceps. The curved suction was then used to suck all of the debris out of that sinus. The ethmoid bulla was then identified and opened using the navigation suction the polypoid disease throughout anterior posterior ethmoid cells which was removed. The frontal sinus was probed and opened using the 45 forceps opening frontal sinus widely. The front wall of the sphenoid was then taken down using the straight and 45 forceps there was polypoid disease throughout the sphenoid which was also removed. A cottonoid pledget was placed on the left the right side was examined again the middle turbinate was gently medialized there was thickened crusty inspissated mucus again on this side which was sent for culture. The uncinate process was taken down using the Cottle elevator the straight and side biting forceps were used to open the maxillary sinus widely and the curved suction was used to clean the sinus out thoroughly. The ethmoid bulla was then opened the anterior posterior ethmoid cells were cleaned and removed of polypoid disease and thickened mucus. The frontal sinus opening was explored and was polypoid disease around this opening which was removed frontal sinus was opened there is no infection seen there. The base the sphenoid sinus wasn'Phillips opened sphenoid had a bit of polypoid mucosa area and it which was removed cottonoid pledget was placed on the right the left side was reexamined with the pledget removed all small bony fragments were removed in one small area of polypoid mucosa which was removed but otherwise sinuses were clear. In similar fashion the right side was  reexamined any small bony fragments of polypoid disease identified was removed. With sinus cavities widely patent the operation then turned to the submucous resection of the inferior  turbinates. Beginning on the left-hand side a 15 blade was used to incise along the inferior edge of the inferior turbinate a superiorly laterally based flap was elevated and the concha bone and underlying mucosa was excised using the [employer]. The flap was laid back over the chonchal stump this was cauterized using suction cautery to prevent bleeding. In similar fashion a submucous section was performed on the right. With no active bleeding Stammberger gel was placed within each nostril up in the ethmoid maxillary cavities. The patient was in return anesthesia where he was awakened in the operating room taken recovery room in stable condition. Prior to surgery a magnet was placed over his pacemaker to prevent any electrostimulation of the of the cautery.  Cultures: Left ethmoid and maxillary sinuses and right ethmoid sinus  Specimens: Sinus contents  Dispo:   Good  Plan:  Discharge to home follow-up 10 days  Danny Phillips  10/09/2016 10:26 AM

## 2016-10-09 NOTE — Anesthesia Postprocedure Evaluation (Signed)
Anesthesia Post Note  Patient: PROSPER PAFF Sr.  Procedure(s) Performed: Procedure(s) (LRB): IMAGE GUIDED SINUS SURGERY (N/A) ETHMOIDECTOMY (Bilateral) MAXILLARY ANTROSTOMY (Bilateral) FRONTAL SINUS EXPLORATION (Bilateral) SPHENOIDECTOMY (Bilateral) TURBINATE REDUCTION/SUBMUCOSAL RESECTION (Bilateral)  Patient location during evaluation: PACU Anesthesia Type: General Level of consciousness: awake and alert Pain management: pain level controlled Vital Signs Assessment: post-procedure vital signs reviewed and stable Respiratory status: spontaneous breathing, nonlabored ventilation and respiratory function stable Cardiovascular status: stable (Magnet was used intraop - rate 85. Removed at end of case. Stable in PACU with rates 60-70.) Postop Assessment: no signs of nausea or vomiting Anesthetic complications: no    Veda Canning

## 2016-10-12 ENCOUNTER — Encounter: Payer: Self-pay | Admitting: Unknown Physician Specialty

## 2016-10-13 DIAGNOSIS — J320 Chronic maxillary sinusitis: Secondary | ICD-10-CM | POA: Diagnosis not present

## 2016-10-13 DIAGNOSIS — J324 Chronic pansinusitis: Secondary | ICD-10-CM | POA: Diagnosis not present

## 2016-10-13 LAB — AEROBIC/ANAEROBIC CULTURE W GRAM STAIN (SURGICAL/DEEP WOUND)

## 2016-10-13 LAB — AEROBIC/ANAEROBIC CULTURE (SURGICAL/DEEP WOUND)

## 2016-10-13 LAB — SURGICAL PATHOLOGY

## 2016-10-14 LAB — AEROBIC/ANAEROBIC CULTURE W GRAM STAIN (SURGICAL/DEEP WOUND)

## 2016-10-14 LAB — AEROBIC/ANAEROBIC CULTURE (SURGICAL/DEEP WOUND): GRAM STAIN: NONE SEEN

## 2016-10-29 DIAGNOSIS — J343 Hypertrophy of nasal turbinates: Secondary | ICD-10-CM | POA: Diagnosis not present

## 2016-10-29 DIAGNOSIS — J320 Chronic maxillary sinusitis: Secondary | ICD-10-CM | POA: Diagnosis not present

## 2016-10-29 DIAGNOSIS — J324 Chronic pansinusitis: Secondary | ICD-10-CM | POA: Diagnosis not present

## 2016-11-19 DIAGNOSIS — J320 Chronic maxillary sinusitis: Secondary | ICD-10-CM | POA: Diagnosis not present

## 2016-12-14 DIAGNOSIS — R413 Other amnesia: Secondary | ICD-10-CM | POA: Diagnosis not present

## 2016-12-14 DIAGNOSIS — E7800 Pure hypercholesterolemia, unspecified: Secondary | ICD-10-CM | POA: Diagnosis not present

## 2016-12-14 DIAGNOSIS — Z8546 Personal history of malignant neoplasm of prostate: Secondary | ICD-10-CM | POA: Diagnosis not present

## 2016-12-14 DIAGNOSIS — I10 Essential (primary) hypertension: Secondary | ICD-10-CM | POA: Diagnosis not present

## 2016-12-14 DIAGNOSIS — Z0000 Encounter for general adult medical examination without abnormal findings: Secondary | ICD-10-CM | POA: Diagnosis not present

## 2016-12-14 DIAGNOSIS — E039 Hypothyroidism, unspecified: Secondary | ICD-10-CM | POA: Diagnosis not present

## 2016-12-28 DIAGNOSIS — J310 Chronic rhinitis: Secondary | ICD-10-CM | POA: Diagnosis not present

## 2016-12-28 DIAGNOSIS — J309 Allergic rhinitis, unspecified: Secondary | ICD-10-CM | POA: Diagnosis not present

## 2017-01-11 DIAGNOSIS — J320 Chronic maxillary sinusitis: Secondary | ICD-10-CM | POA: Diagnosis not present

## 2017-02-09 DIAGNOSIS — I495 Sick sinus syndrome: Secondary | ICD-10-CM | POA: Diagnosis not present

## 2017-02-12 DIAGNOSIS — R946 Abnormal results of thyroid function studies: Secondary | ICD-10-CM | POA: Diagnosis not present

## 2017-02-15 DIAGNOSIS — R0602 Shortness of breath: Secondary | ICD-10-CM | POA: Diagnosis not present

## 2017-02-15 DIAGNOSIS — J449 Chronic obstructive pulmonary disease, unspecified: Secondary | ICD-10-CM | POA: Diagnosis not present

## 2017-03-08 DIAGNOSIS — I480 Paroxysmal atrial fibrillation: Secondary | ICD-10-CM | POA: Diagnosis not present

## 2017-03-08 DIAGNOSIS — I4891 Unspecified atrial fibrillation: Secondary | ICD-10-CM | POA: Diagnosis not present

## 2017-03-15 DIAGNOSIS — R002 Palpitations: Secondary | ICD-10-CM | POA: Diagnosis not present

## 2017-03-29 DIAGNOSIS — Z950 Presence of cardiac pacemaker: Secondary | ICD-10-CM | POA: Diagnosis not present

## 2017-03-29 DIAGNOSIS — E7800 Pure hypercholesterolemia, unspecified: Secondary | ICD-10-CM | POA: Diagnosis not present

## 2017-03-29 DIAGNOSIS — I10 Essential (primary) hypertension: Secondary | ICD-10-CM | POA: Diagnosis not present

## 2017-03-29 DIAGNOSIS — R0602 Shortness of breath: Secondary | ICD-10-CM | POA: Diagnosis not present

## 2017-04-14 DIAGNOSIS — J320 Chronic maxillary sinusitis: Secondary | ICD-10-CM | POA: Diagnosis not present

## 2017-04-14 DIAGNOSIS — R05 Cough: Secondary | ICD-10-CM | POA: Diagnosis not present

## 2017-04-29 DIAGNOSIS — J019 Acute sinusitis, unspecified: Secondary | ICD-10-CM | POA: Diagnosis not present

## 2017-04-29 DIAGNOSIS — R51 Headache: Secondary | ICD-10-CM | POA: Diagnosis not present

## 2017-06-02 DIAGNOSIS — J309 Allergic rhinitis, unspecified: Secondary | ICD-10-CM | POA: Diagnosis not present

## 2017-06-02 DIAGNOSIS — J324 Chronic pansinusitis: Secondary | ICD-10-CM | POA: Diagnosis not present

## 2017-06-16 DIAGNOSIS — J42 Unspecified chronic bronchitis: Secondary | ICD-10-CM | POA: Diagnosis not present

## 2017-06-16 DIAGNOSIS — E7800 Pure hypercholesterolemia, unspecified: Secondary | ICD-10-CM | POA: Diagnosis not present

## 2017-06-16 DIAGNOSIS — I10 Essential (primary) hypertension: Secondary | ICD-10-CM | POA: Diagnosis not present

## 2017-06-16 DIAGNOSIS — R05 Cough: Secondary | ICD-10-CM | POA: Diagnosis not present

## 2017-06-16 DIAGNOSIS — E039 Hypothyroidism, unspecified: Secondary | ICD-10-CM | POA: Diagnosis not present

## 2017-07-02 DIAGNOSIS — R710 Precipitous drop in hematocrit: Secondary | ICD-10-CM | POA: Diagnosis not present

## 2017-07-02 DIAGNOSIS — D72829 Elevated white blood cell count, unspecified: Secondary | ICD-10-CM | POA: Diagnosis not present

## 2017-07-05 DIAGNOSIS — I10 Essential (primary) hypertension: Secondary | ICD-10-CM | POA: Diagnosis not present

## 2017-07-05 DIAGNOSIS — E785 Hyperlipidemia, unspecified: Secondary | ICD-10-CM | POA: Diagnosis not present

## 2017-07-05 DIAGNOSIS — Z950 Presence of cardiac pacemaker: Secondary | ICD-10-CM | POA: Diagnosis not present

## 2017-07-05 DIAGNOSIS — R0602 Shortness of breath: Secondary | ICD-10-CM | POA: Diagnosis not present

## 2017-07-22 DIAGNOSIS — L309 Dermatitis, unspecified: Secondary | ICD-10-CM | POA: Diagnosis not present

## 2017-07-22 DIAGNOSIS — D692 Other nonthrombocytopenic purpura: Secondary | ICD-10-CM | POA: Diagnosis not present

## 2017-07-22 DIAGNOSIS — L300 Nummular dermatitis: Secondary | ICD-10-CM | POA: Diagnosis not present

## 2017-08-12 DIAGNOSIS — L2089 Other atopic dermatitis: Secondary | ICD-10-CM | POA: Diagnosis not present

## 2017-08-12 DIAGNOSIS — L309 Dermatitis, unspecified: Secondary | ICD-10-CM | POA: Diagnosis not present

## 2017-08-16 DIAGNOSIS — R911 Solitary pulmonary nodule: Secondary | ICD-10-CM | POA: Diagnosis not present

## 2017-08-16 DIAGNOSIS — R0609 Other forms of dyspnea: Secondary | ICD-10-CM | POA: Diagnosis not present

## 2017-08-16 DIAGNOSIS — J449 Chronic obstructive pulmonary disease, unspecified: Secondary | ICD-10-CM | POA: Diagnosis not present

## 2017-10-04 DIAGNOSIS — Z950 Presence of cardiac pacemaker: Secondary | ICD-10-CM | POA: Diagnosis not present

## 2017-10-04 DIAGNOSIS — I10 Essential (primary) hypertension: Secondary | ICD-10-CM | POA: Diagnosis not present

## 2017-10-04 DIAGNOSIS — E785 Hyperlipidemia, unspecified: Secondary | ICD-10-CM | POA: Diagnosis not present

## 2017-10-04 DIAGNOSIS — R0602 Shortness of breath: Secondary | ICD-10-CM | POA: Diagnosis not present

## 2017-10-04 DIAGNOSIS — J42 Unspecified chronic bronchitis: Secondary | ICD-10-CM | POA: Diagnosis not present

## 2017-10-27 DIAGNOSIS — J309 Allergic rhinitis, unspecified: Secondary | ICD-10-CM | POA: Diagnosis not present

## 2017-10-27 DIAGNOSIS — J324 Chronic pansinusitis: Secondary | ICD-10-CM | POA: Diagnosis not present

## 2017-12-27 DIAGNOSIS — E785 Hyperlipidemia, unspecified: Secondary | ICD-10-CM | POA: Diagnosis not present

## 2017-12-27 DIAGNOSIS — Z0000 Encounter for general adult medical examination without abnormal findings: Secondary | ICD-10-CM | POA: Diagnosis not present

## 2017-12-27 DIAGNOSIS — E039 Hypothyroidism, unspecified: Secondary | ICD-10-CM | POA: Diagnosis not present

## 2017-12-27 DIAGNOSIS — K219 Gastro-esophageal reflux disease without esophagitis: Secondary | ICD-10-CM | POA: Diagnosis not present

## 2017-12-27 DIAGNOSIS — Z8546 Personal history of malignant neoplasm of prostate: Secondary | ICD-10-CM | POA: Diagnosis not present

## 2017-12-27 DIAGNOSIS — I10 Essential (primary) hypertension: Secondary | ICD-10-CM | POA: Diagnosis not present

## 2017-12-29 DIAGNOSIS — Z23 Encounter for immunization: Secondary | ICD-10-CM | POA: Diagnosis not present

## 2018-01-11 DIAGNOSIS — J449 Chronic obstructive pulmonary disease, unspecified: Secondary | ICD-10-CM | POA: Diagnosis not present

## 2018-01-11 DIAGNOSIS — R0609 Other forms of dyspnea: Secondary | ICD-10-CM | POA: Diagnosis not present

## 2018-01-11 DIAGNOSIS — J310 Chronic rhinitis: Secondary | ICD-10-CM | POA: Diagnosis not present

## 2018-02-08 DIAGNOSIS — I495 Sick sinus syndrome: Secondary | ICD-10-CM | POA: Diagnosis not present

## 2018-02-15 DIAGNOSIS — R946 Abnormal results of thyroid function studies: Secondary | ICD-10-CM | POA: Diagnosis not present

## 2018-02-15 DIAGNOSIS — R7989 Other specified abnormal findings of blood chemistry: Secondary | ICD-10-CM | POA: Diagnosis not present

## 2018-02-24 DIAGNOSIS — L853 Xerosis cutis: Secondary | ICD-10-CM | POA: Diagnosis not present

## 2018-02-24 DIAGNOSIS — L209 Atopic dermatitis, unspecified: Secondary | ICD-10-CM | POA: Diagnosis not present

## 2018-04-04 DIAGNOSIS — J42 Unspecified chronic bronchitis: Secondary | ICD-10-CM | POA: Diagnosis not present

## 2018-04-04 DIAGNOSIS — R0602 Shortness of breath: Secondary | ICD-10-CM | POA: Diagnosis not present

## 2018-04-04 DIAGNOSIS — E785 Hyperlipidemia, unspecified: Secondary | ICD-10-CM | POA: Diagnosis not present

## 2018-04-04 DIAGNOSIS — I10 Essential (primary) hypertension: Secondary | ICD-10-CM | POA: Diagnosis not present

## 2018-04-04 DIAGNOSIS — Z950 Presence of cardiac pacemaker: Secondary | ICD-10-CM | POA: Diagnosis not present

## 2018-04-11 DIAGNOSIS — I10 Essential (primary) hypertension: Secondary | ICD-10-CM | POA: Diagnosis not present

## 2018-04-11 DIAGNOSIS — E039 Hypothyroidism, unspecified: Secondary | ICD-10-CM | POA: Diagnosis not present

## 2018-04-11 DIAGNOSIS — L304 Erythema intertrigo: Secondary | ICD-10-CM | POA: Diagnosis not present

## 2018-04-11 DIAGNOSIS — L2089 Other atopic dermatitis: Secondary | ICD-10-CM | POA: Diagnosis not present

## 2018-04-27 DIAGNOSIS — J324 Chronic pansinusitis: Secondary | ICD-10-CM | POA: Diagnosis not present

## 2018-04-27 DIAGNOSIS — J3489 Other specified disorders of nose and nasal sinuses: Secondary | ICD-10-CM | POA: Diagnosis not present

## 2018-05-04 DIAGNOSIS — L720 Epidermal cyst: Secondary | ICD-10-CM | POA: Diagnosis not present

## 2018-05-04 DIAGNOSIS — L570 Actinic keratosis: Secondary | ICD-10-CM | POA: Diagnosis not present

## 2018-05-04 DIAGNOSIS — Z85828 Personal history of other malignant neoplasm of skin: Secondary | ICD-10-CM | POA: Diagnosis not present

## 2018-05-04 DIAGNOSIS — L578 Other skin changes due to chronic exposure to nonionizing radiation: Secondary | ICD-10-CM | POA: Diagnosis not present

## 2018-05-04 DIAGNOSIS — L0291 Cutaneous abscess, unspecified: Secondary | ICD-10-CM | POA: Diagnosis not present

## 2018-07-01 DIAGNOSIS — E785 Hyperlipidemia, unspecified: Secondary | ICD-10-CM | POA: Diagnosis not present

## 2018-07-01 DIAGNOSIS — K219 Gastro-esophageal reflux disease without esophagitis: Secondary | ICD-10-CM | POA: Diagnosis not present

## 2018-07-01 DIAGNOSIS — I10 Essential (primary) hypertension: Secondary | ICD-10-CM | POA: Diagnosis not present

## 2018-07-01 DIAGNOSIS — R0602 Shortness of breath: Secondary | ICD-10-CM | POA: Diagnosis not present

## 2018-07-01 DIAGNOSIS — E039 Hypothyroidism, unspecified: Secondary | ICD-10-CM | POA: Diagnosis not present

## 2018-07-04 DIAGNOSIS — L570 Actinic keratosis: Secondary | ICD-10-CM | POA: Diagnosis not present

## 2018-07-04 DIAGNOSIS — L821 Other seborrheic keratosis: Secondary | ICD-10-CM | POA: Diagnosis not present

## 2018-07-04 DIAGNOSIS — L720 Epidermal cyst: Secondary | ICD-10-CM | POA: Diagnosis not present

## 2018-07-04 DIAGNOSIS — L578 Other skin changes due to chronic exposure to nonionizing radiation: Secondary | ICD-10-CM | POA: Diagnosis not present

## 2018-07-04 DIAGNOSIS — L812 Freckles: Secondary | ICD-10-CM | POA: Diagnosis not present

## 2018-07-14 DIAGNOSIS — R634 Abnormal weight loss: Secondary | ICD-10-CM | POA: Diagnosis not present

## 2018-07-14 DIAGNOSIS — R0602 Shortness of breath: Secondary | ICD-10-CM | POA: Diagnosis not present

## 2018-07-30 DIAGNOSIS — J9801 Acute bronchospasm: Secondary | ICD-10-CM | POA: Diagnosis not present

## 2018-07-30 DIAGNOSIS — J441 Chronic obstructive pulmonary disease with (acute) exacerbation: Secondary | ICD-10-CM | POA: Diagnosis not present

## 2018-07-30 DIAGNOSIS — R6889 Other general symptoms and signs: Secondary | ICD-10-CM | POA: Diagnosis not present

## 2018-07-30 DIAGNOSIS — B9789 Other viral agents as the cause of diseases classified elsewhere: Secondary | ICD-10-CM | POA: Diagnosis not present

## 2018-07-30 DIAGNOSIS — J028 Acute pharyngitis due to other specified organisms: Secondary | ICD-10-CM | POA: Diagnosis not present

## 2018-07-30 DIAGNOSIS — J189 Pneumonia, unspecified organism: Secondary | ICD-10-CM | POA: Diagnosis not present

## 2018-07-30 DIAGNOSIS — R05 Cough: Secondary | ICD-10-CM | POA: Diagnosis not present

## 2018-07-30 DIAGNOSIS — R509 Fever, unspecified: Secondary | ICD-10-CM | POA: Diagnosis not present

## 2018-08-03 DIAGNOSIS — J189 Pneumonia, unspecified organism: Secondary | ICD-10-CM | POA: Diagnosis not present

## 2018-08-03 DIAGNOSIS — J9801 Acute bronchospasm: Secondary | ICD-10-CM | POA: Diagnosis not present

## 2018-08-03 DIAGNOSIS — J441 Chronic obstructive pulmonary disease with (acute) exacerbation: Secondary | ICD-10-CM | POA: Diagnosis not present

## 2018-08-09 DIAGNOSIS — I495 Sick sinus syndrome: Secondary | ICD-10-CM | POA: Diagnosis not present

## 2018-08-10 DIAGNOSIS — J441 Chronic obstructive pulmonary disease with (acute) exacerbation: Secondary | ICD-10-CM | POA: Diagnosis not present

## 2018-08-10 DIAGNOSIS — N185 Chronic kidney disease, stage 5: Secondary | ICD-10-CM | POA: Diagnosis not present

## 2018-08-10 DIAGNOSIS — J189 Pneumonia, unspecified organism: Secondary | ICD-10-CM | POA: Diagnosis not present

## 2018-08-10 DIAGNOSIS — R1013 Epigastric pain: Secondary | ICD-10-CM | POA: Diagnosis not present

## 2018-11-20 IMAGING — CT CT CHEST W/O CM
2 of 3 series · 15 of 36 positions shown, 18 images · non-contrast
Comparison: 10/03/2015

CLINICAL DATA: Followup lung nodules

EXAM:
CT CHEST WITHOUT CONTRAST
TECHNIQUE: Multidetector CT imaging of the chest was performed following the
standard protocol without IV contrast.

[Series 2: thorax · axial · 0.75mm/px · z∈[-356,-82]mm · 12 of 161 slices shown, 15 images]
[im 12/161  mediastinal]
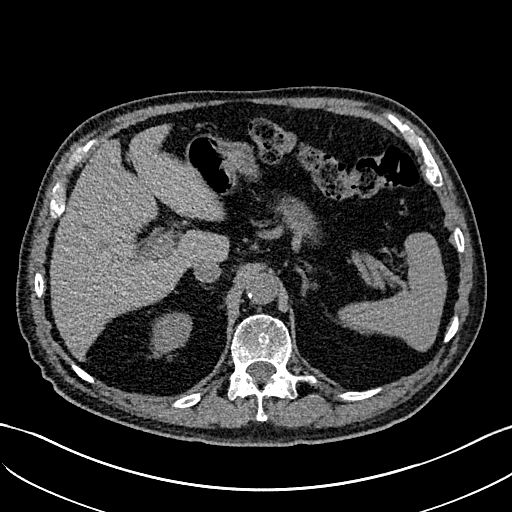
[im 12/161  lung]
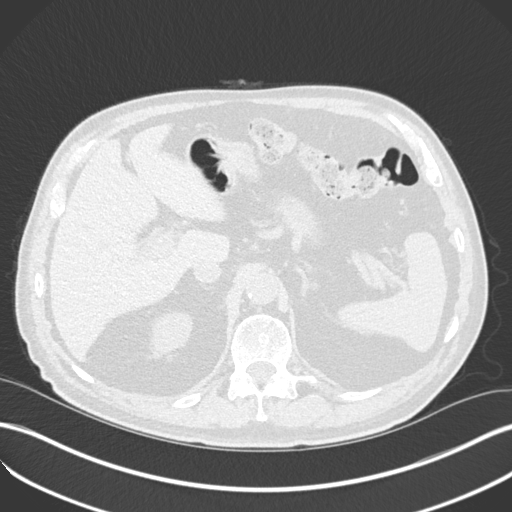
[im 24/161  lung]
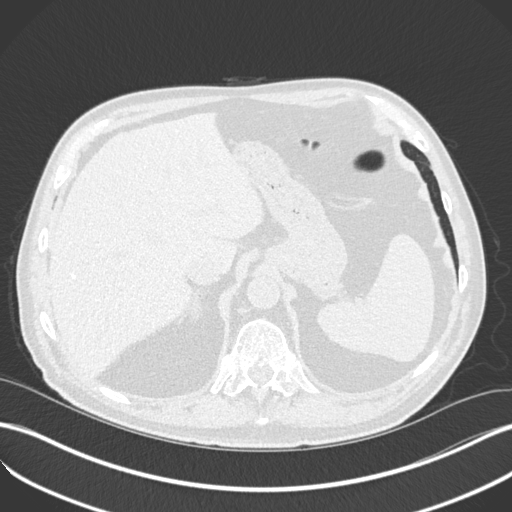
[im 36/161  lung]
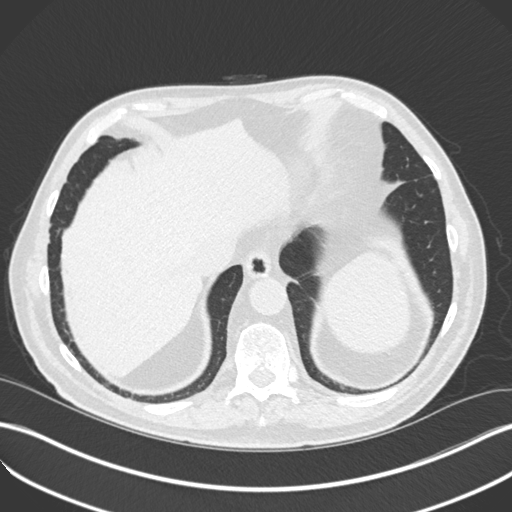
[im 48/161  lung]
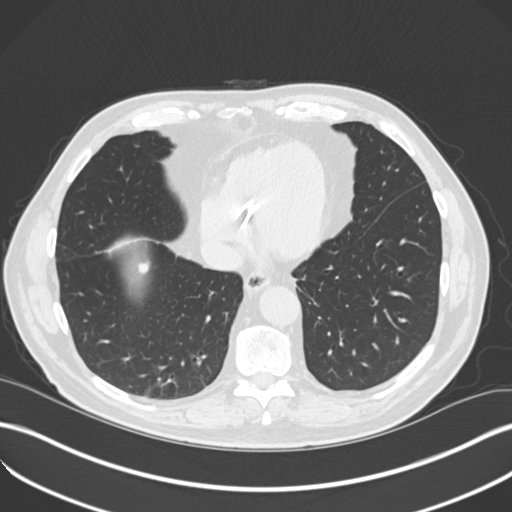
[im 60/161  mediastinal]
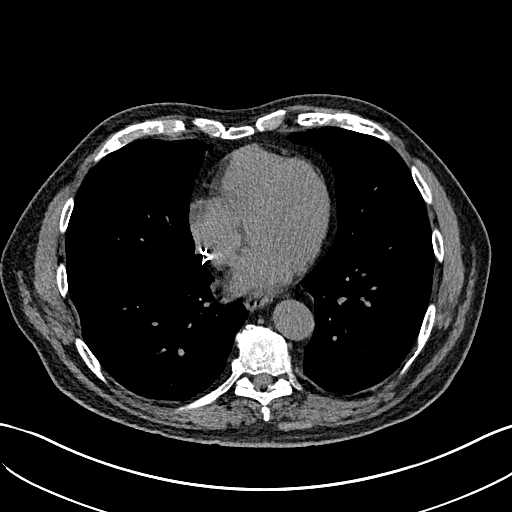
[im 60/161  lung]
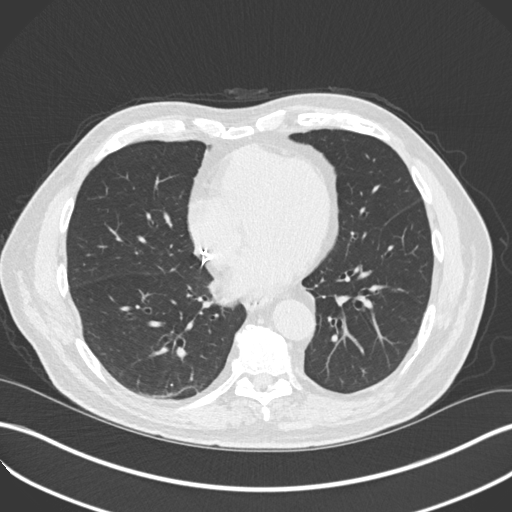
[im 72/161  lung]
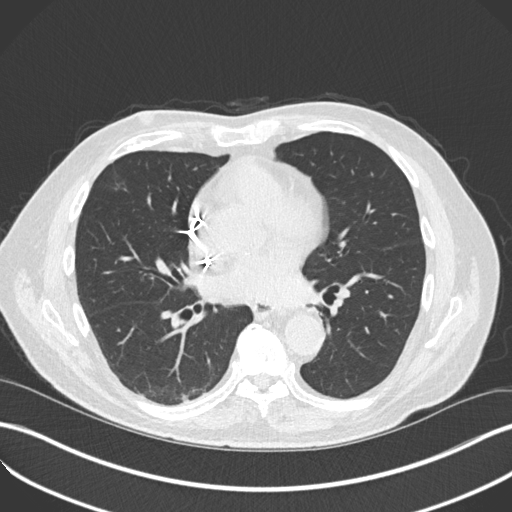
[im 89/161  lung]
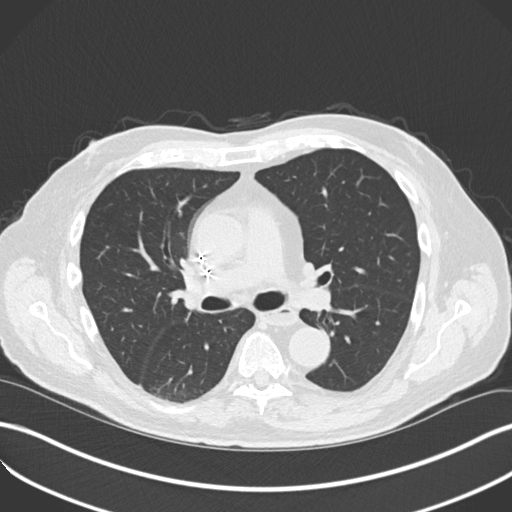
[im 101/161  lung]
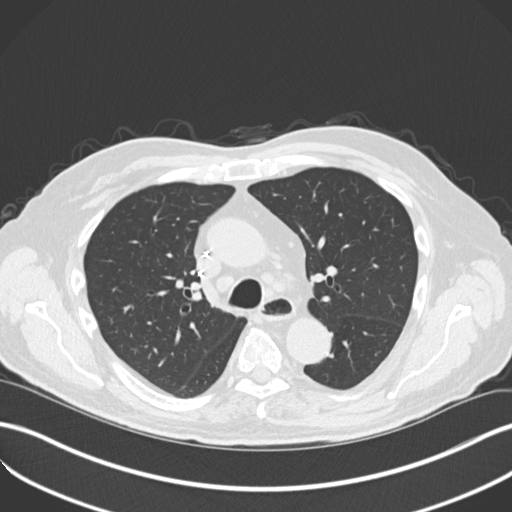
[im 113/161  mediastinal]
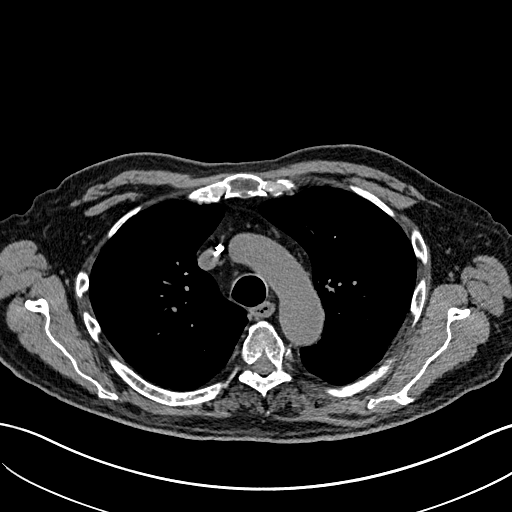
[im 113/161  lung]
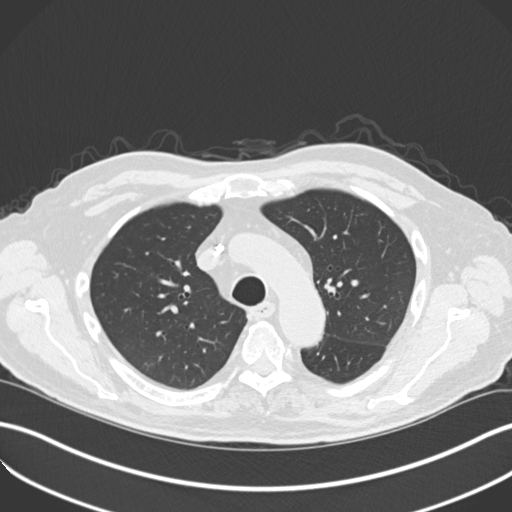
[im 125/161  lung]
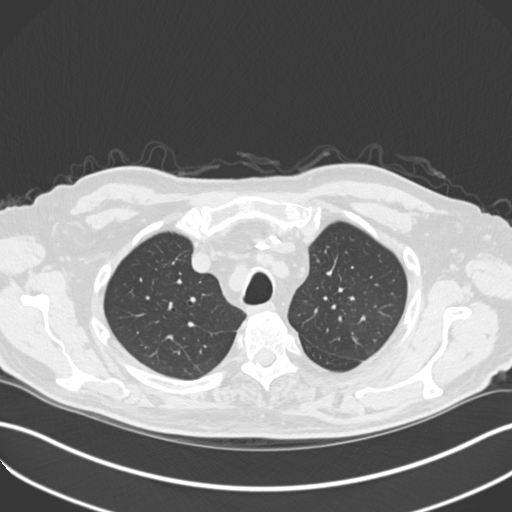
[im 137/161  lung]
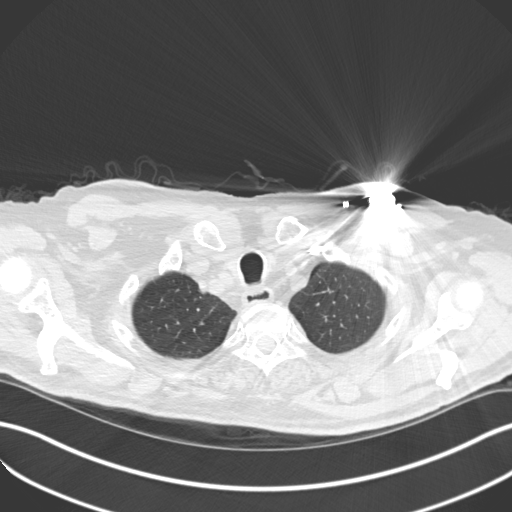
[im 149/161  lung]
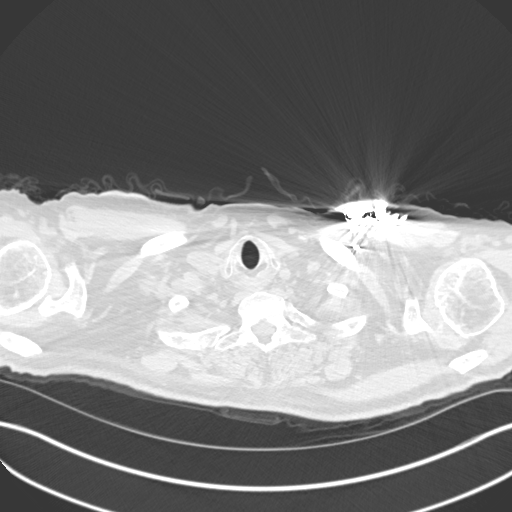

[Series 5: coronal · coronal · 0.67mm/px · 3 of 140 slices shown]
[im 28/140  lung]
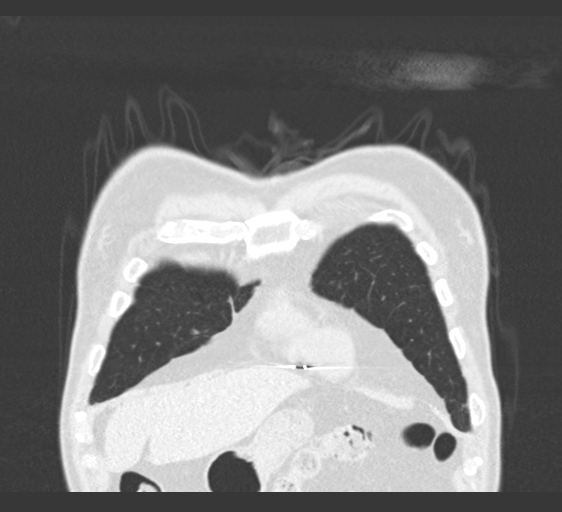
[im 56/140  lung]
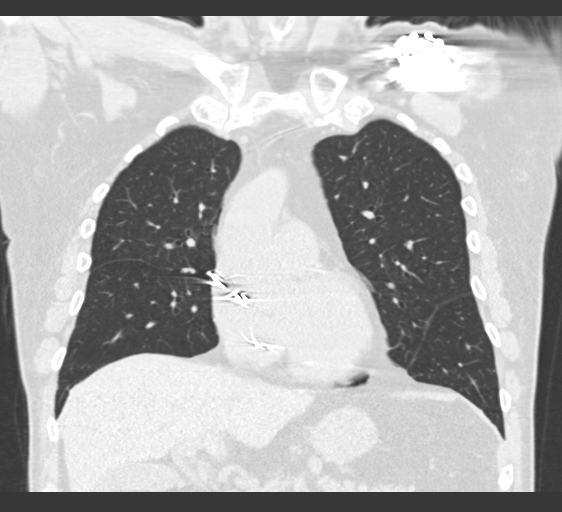
[im 84/140  lung]
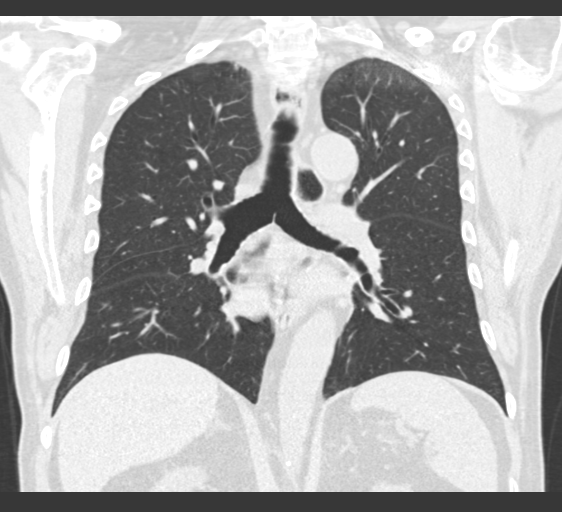

[15 of 36 positions shown; findings below may reference images not displayed]

FINDINGS: Cardiovascular: Normal heart size. There is no pericardial effusion
identified. Left chest wall pacer device is noted with leads in the
right atrial appendage and right ventricle. Aortic atherosclerosis.
Calcifications noted within the LAD coronary artery.

Mediastinum/Nodes: The trachea appears patent and is midline. Normal
appearance of the esophagus. Stable 9 mm right paratracheal lymph
node. Prominent left paratracheal lymph node measures 8 mm, image 59
of series 2.

Lungs/Pleura: Right upper lobe granuloma is unchanged, image 51 of
series 3. Tiny right middle lobe lung nodule is also unchanged
measuring approximately 2-3 mm, image 99 of series 3. Subpleural
nodule at the right lung base measures 7 mm and is unchanged, image
114 of series 3. There are no new or enlarging pulmonary nodules or
masses identified.

Upper Abdomen: No acute abnormality.

Musculoskeletal: Mild spondylosis within the thoracic spine.
IMPRESSION: 1. Stable appearance of right lung nodule. Findings compatible with
a benign process. No further followup is indicated at this time.
2. Aortic atherosclerosis and LAD coronary artery calcification
3. Prior granulomatous disease.

## 2019-01-16 DIAGNOSIS — K219 Gastro-esophageal reflux disease without esophagitis: Secondary | ICD-10-CM | POA: Diagnosis not present

## 2019-01-16 DIAGNOSIS — E785 Hyperlipidemia, unspecified: Secondary | ICD-10-CM | POA: Diagnosis not present

## 2019-01-16 DIAGNOSIS — E039 Hypothyroidism, unspecified: Secondary | ICD-10-CM | POA: Diagnosis not present

## 2019-01-16 DIAGNOSIS — I10 Essential (primary) hypertension: Secondary | ICD-10-CM | POA: Diagnosis not present

## 2019-01-16 DIAGNOSIS — J42 Unspecified chronic bronchitis: Secondary | ICD-10-CM | POA: Diagnosis not present

## 2019-01-16 DIAGNOSIS — Z0000 Encounter for general adult medical examination without abnormal findings: Secondary | ICD-10-CM | POA: Diagnosis not present

## 2019-01-16 DIAGNOSIS — Z8546 Personal history of malignant neoplasm of prostate: Secondary | ICD-10-CM | POA: Diagnosis not present

## 2019-01-25 DIAGNOSIS — K644 Residual hemorrhoidal skin tags: Secondary | ICD-10-CM | POA: Diagnosis not present

## 2019-01-25 DIAGNOSIS — L89302 Pressure ulcer of unspecified buttock, stage 2: Secondary | ICD-10-CM | POA: Diagnosis not present

## 2019-02-14 DIAGNOSIS — I495 Sick sinus syndrome: Secondary | ICD-10-CM | POA: Diagnosis not present

## 2019-02-16 DIAGNOSIS — L578 Other skin changes due to chronic exposure to nonionizing radiation: Secondary | ICD-10-CM | POA: Diagnosis not present

## 2019-02-16 DIAGNOSIS — D1800 Hemangioma unspecified site: Secondary | ICD-10-CM | POA: Diagnosis not present

## 2019-02-16 DIAGNOSIS — L570 Actinic keratosis: Secondary | ICD-10-CM | POA: Diagnosis not present

## 2019-02-16 DIAGNOSIS — D2230 Melanocytic nevi of unspecified part of face: Secondary | ICD-10-CM | POA: Diagnosis not present

## 2019-02-16 DIAGNOSIS — D229 Melanocytic nevi, unspecified: Secondary | ICD-10-CM | POA: Diagnosis not present

## 2019-02-16 DIAGNOSIS — D225 Melanocytic nevi of trunk: Secondary | ICD-10-CM | POA: Diagnosis not present

## 2019-02-16 DIAGNOSIS — L821 Other seborrheic keratosis: Secondary | ICD-10-CM | POA: Diagnosis not present

## 2019-02-16 DIAGNOSIS — L209 Atopic dermatitis, unspecified: Secondary | ICD-10-CM | POA: Diagnosis not present

## 2019-02-16 DIAGNOSIS — Z1283 Encounter for screening for malignant neoplasm of skin: Secondary | ICD-10-CM | POA: Diagnosis not present

## 2019-02-16 DIAGNOSIS — L720 Epidermal cyst: Secondary | ICD-10-CM | POA: Diagnosis not present

## 2019-02-16 DIAGNOSIS — L814 Other melanin hyperpigmentation: Secondary | ICD-10-CM | POA: Diagnosis not present

## 2019-03-08 ENCOUNTER — Emergency Department: Payer: PPO

## 2019-03-08 ENCOUNTER — Other Ambulatory Visit: Payer: Self-pay

## 2019-03-08 ENCOUNTER — Emergency Department
Admission: EM | Admit: 2019-03-08 | Discharge: 2019-03-08 | Disposition: A | Discharge status: Home / Self Care | Payer: PPO | Attending: Emergency Medicine | Admitting: Emergency Medicine

## 2019-03-08 DIAGNOSIS — Z79899 Other long term (current) drug therapy: Secondary | ICD-10-CM | POA: Insufficient documentation

## 2019-03-08 DIAGNOSIS — K5730 Diverticulosis of large intestine without perforation or abscess without bleeding: Secondary | ICD-10-CM | POA: Diagnosis not present

## 2019-03-08 DIAGNOSIS — Z87891 Personal history of nicotine dependence: Secondary | ICD-10-CM | POA: Diagnosis not present

## 2019-03-08 DIAGNOSIS — Z950 Presence of cardiac pacemaker: Secondary | ICD-10-CM | POA: Insufficient documentation

## 2019-03-08 DIAGNOSIS — N200 Calculus of kidney: Secondary | ICD-10-CM | POA: Diagnosis not present

## 2019-03-08 DIAGNOSIS — N189 Chronic kidney disease, unspecified: Secondary | ICD-10-CM | POA: Diagnosis not present

## 2019-03-08 DIAGNOSIS — I129 Hypertensive chronic kidney disease with stage 1 through stage 4 chronic kidney disease, or unspecified chronic kidney disease: Secondary | ICD-10-CM | POA: Insufficient documentation

## 2019-03-08 DIAGNOSIS — J449 Chronic obstructive pulmonary disease, unspecified: Secondary | ICD-10-CM | POA: Insufficient documentation

## 2019-03-08 DIAGNOSIS — R109 Unspecified abdominal pain: Secondary | ICD-10-CM | POA: Diagnosis present

## 2019-03-08 DIAGNOSIS — R350 Frequency of micturition: Secondary | ICD-10-CM | POA: Diagnosis not present

## 2019-03-08 DIAGNOSIS — N134 Hydroureter: Secondary | ICD-10-CM | POA: Diagnosis not present

## 2019-03-08 DIAGNOSIS — N132 Hydronephrosis with renal and ureteral calculous obstruction: Secondary | ICD-10-CM | POA: Diagnosis not present

## 2019-03-08 DIAGNOSIS — K4090 Unilateral inguinal hernia, without obstruction or gangrene, not specified as recurrent: Secondary | ICD-10-CM | POA: Diagnosis not present

## 2019-03-08 LAB — BASIC METABOLIC PANEL
Anion gap: 11 (ref 5–15)
BUN: 67 mg/dL — ABNORMAL HIGH (ref 8–23)
CO2: 16 mmol/L — ABNORMAL LOW (ref 22–32)
Calcium: 9.2 mg/dL (ref 8.9–10.3)
Chloride: 109 mmol/L (ref 98–111)
Creatinine, Ser: 5.66 mg/dL — ABNORMAL HIGH (ref 0.61–1.24)
GFR calc Af Amer: 10 mL/min — ABNORMAL LOW (ref >60)
GFR calc non Af Amer: 8 mL/min — ABNORMAL LOW (ref >60)
Glucose, Bld: 107 mg/dL — ABNORMAL HIGH (ref 70–99)
Potassium: 5.2 mmol/L — ABNORMAL HIGH (ref 3.5–5.1)
Sodium: 136 mmol/L (ref 135–145)

## 2019-03-08 LAB — URINALYSIS, COMPLETE (UACMP) WITH MICROSCOPIC
Bacteria, UA: NONE SEEN
Bilirubin Urine: NEGATIVE
Glucose, UA: NEGATIVE mg/dL
Ketones, ur: NEGATIVE mg/dL
Leukocytes,Ua: NEGATIVE
Nitrite: NEGATIVE
Protein, ur: 30 mg/dL — AB
Specific Gravity, Urine: 1.008 (ref 1.005–1.030)
Squamous Epithelial / LPF: NONE SEEN (ref 0–5)
pH: 5 (ref 5.0–8.0)

## 2019-03-08 LAB — CBC
HCT: 35 % — ABNORMAL LOW (ref 39.0–52.0)
Hemoglobin: 11.2 g/dL — ABNORMAL LOW (ref 13.0–17.0)
MCH: 29.4 pg (ref 26.0–34.0)
MCHC: 32 g/dL (ref 30.0–36.0)
MCV: 91.9 fL (ref 80.0–100.0)
Platelets: 199 10*3/uL (ref 150–400)
RBC: 3.81 MIL/uL — ABNORMAL LOW (ref 4.22–5.81)
RDW: 13.5 % (ref 11.5–15.5)
WBC: 15.8 10*3/uL — ABNORMAL HIGH (ref 4.0–10.5)
nRBC: 0 % (ref 0.0–0.2)

## 2019-03-08 MED ORDER — ONDANSETRON HCL 4 MG/2ML IJ SOLN
4.0000 mg | Freq: Once | INTRAMUSCULAR | Status: AC
  Administered 2019-03-08: 16:00:00 4 mg via INTRAVENOUS
  Filled 2019-03-08: qty 2

## 2019-03-08 MED ORDER — SODIUM CHLORIDE 0.9 % IV SOLN
1000.0000 mL | Freq: Once | INTRAVENOUS | Status: AC
  Administered 2019-03-08: 16:00:00 1000 mL via INTRAVENOUS

## 2019-03-08 MED ORDER — TAMSULOSIN HCL 0.4 MG PO CAPS: 0.4000 mg | ORAL_CAPSULE | Freq: Every day | ORAL | 0 refills | Status: DC

## 2019-03-08 MED ORDER — TRAMADOL HCL 50 MG PO TABS: 50.0000 mg | ORAL_TABLET | Freq: Four times a day (QID) | ORAL | 0 refills | Status: DC | PRN

## 2019-03-08 MED ORDER — ONDANSETRON 4 MG PO TBDP: 4.0000 mg | ORAL_TABLET | Freq: Three times a day (TID) | ORAL | 0 refills | Status: DC | PRN

## 2019-03-08 NOTE — ED Triage Notes (Signed)
Pt c/o left flank pain since yesterday with a  Hx of kidney stones. Pt is in NAD at present. Pt is HOH.

## 2019-03-08 NOTE — ED Provider Notes (Signed)
[facility] Emergency Department Provider Note   ____________________________________________    I have reviewed the triage vital signs and the nursing notes.   HISTORY  Chief Complaint Flank Pain     HPI Danny Phillips. is a 85 y.o. male who presents with complaints of left-sided flank pain.  Patient reports symptoms been ongoing for about 1 to 2 days, at times it is been severe, right now it is mild.  Does report a history of bladder stones in the past discovered during surgery.  Denies fevers or chills.  No nausea or vomiting.  Pain is in the left lower back primarily, has not take anything for this  Past Medical History:  Diagnosis Date  . Basal cell carcinoma   . Complication of anesthesia    PONV after surg in the 60's.  no issues recently  . COPD (chronic obstructive pulmonary disease) ([place])   . Hearing aid worn    bilateral  . Hemorrhoids   . History of kidney stones   . History of shingles   . Hyperlipidemia   . Hypertension   . Hypothyroidism   . Presence of permanent cardiac pacemaker 08/16/2014   (2nd Pacemaker) Medtronic Adapta Model: ADDR01. Serial #: [id number]  . Prostate cancer ([place])   . RBBB (right bundle branch block)     Patient Active Problem List   Diagnosis Date Noted  . Chronic bronchitis ([place]) 04/17/2016  . Gastro-esophageal reflux disease without esophagitis 04/17/2015  . HLD (hyperlipidemia) 04/17/2015  . Breathlessness on exertion 11/29/2014  . Acquired hypothyroidism 10/04/2014  . Artificial cardiac pacemaker 08/09/2014  . Essential (primary) hypertension 08/09/2014  . Pure hypercholesterolemia 08/09/2014    Past Surgical History:  Procedure Laterality Date  . CATARACT EXTRACTION W/ INTRAOCULAR LENS  IMPLANT, BILATERAL    . ETHMOIDECTOMY Bilateral 10/09/2016   Procedure: ETHMOIDECTOMY;  Surgeon: Beverly Gust, MD;  Location: [place];  Service: ENT;  Laterality: Bilateral;  . FRONTAL  SINUS EXPLORATION Bilateral 10/09/2016   Procedure: FRONTAL SINUS EXPLORATION;  Surgeon: Beverly Gust, MD;  Location: [place];  Service: ENT;  Laterality: Bilateral;  . HERNIA REPAIR     x3  . IMAGE GUIDED SINUS SURGERY N/A 10/09/2016   Procedure: IMAGE GUIDED SINUS SURGERY;  Surgeon: Beverly Gust, MD;  Location: [place];  Service: ENT;  Laterality: N/A;  patient has a pacemaker need stryker disk GAVE DISK TO [facility] 3-27 KP  . INSERT / REPLACE / REMOVE PACEMAKER  08/16/2014   2nd pacemaker Medtronic Adapta ADDR01 Serial #: [id number]  . MAXILLARY ANTROSTOMY Bilateral 10/09/2016   Procedure: MAXILLARY ANTROSTOMY;  Surgeon: Beverly Gust, MD;  Location: [place];  Service: ENT;  Laterality: Bilateral;  . NASAL TURBINATE REDUCTION Bilateral 10/09/2016   Procedure: TURBINATE REDUCTION/SUBMUCOSAL RESECTION;  Surgeon: Beverly Gust, MD;  Location: [place];  Service: ENT;  Laterality: Bilateral;  . PROSTATE SURGERY    . SPHENOIDECTOMY Bilateral 10/09/2016   Procedure: SPHENOIDECTOMY;  Surgeon: Beverly Gust, MD;  Location: [place];  Service: ENT;  Laterality: Bilateral;  . TONSILLECTOMY AND ADENOIDECTOMY      Prior to Admission medications   Medication Sig Start Date End Date Taking? Authorizing Provider  ADVAIR HFA 115-21 MCG/ACT inhaler Inhale 2 puffs into the lungs as needed. 06/28/16   [provider]  ibuprofen (ADVIL,MOTRIN) 200 MG tablet Take 200 mg by mouth every 8 (eight) hours as needed for moderate pain.    [provider]  levothyroxine (  SYNTHROID, LEVOTHROID) 100 MCG tablet Take 1 tablet by mouth 1 day or 1 dose. 07/23/16   [provider]  loratadine (CLARITIN) 10 MG tablet Take by mouth. 09/17/15   [provider]  losartan (COZAAR) 100 MG tablet Take by mouth. 09/18/15   [provider]  lovastatin (MEVACOR) 40 MG tablet TAKE 1 TABLET (40 MG TOTAL) BY MOUTH NIGHTLY. 09/17/15    [provider]  Multiple Vitamin (MULTI-VITAMINS) TABS Take by mouth.    [provider]  Omega-3 Fatty Acids (FISH OIL) 1000 MG CAPS Take by mouth.    [provider]  omeprazole (PRILOSEC) 20 MG capsule TAKE 1 CAPSULE (20 MG TOTAL) BY MOUTH ONCE DAILY. 09/17/15   [provider]  ondansetron (ZOFRAN ODT) 4 MG disintegrating tablet Take 1 tablet (4 mg total) by mouth every 8 (eight) hours as needed. 03/08/19   Lavonia Drafts, MD  oxyCODONE-acetaminophen (ROXICET) 5-325 MG tablet Take 1-2 tablets by mouth every 4 (four) hours as needed. 10/09/16   Beverly Gust, MD  predniSONE (STERAPRED UNI-PAK 48 TAB) 10 MG (48) TBPK tablet Take 1 tablet by mouth 1 day or 1 dose. 09/16/16   [provider]  sulfamethoxazole-trimethoprim (BACTRIM DS,SEPTRA DS) 800-160 MG tablet Take 1 tablet by mouth 2 (two) times daily. 10/09/16   Beverly Gust, MD  tamsulosin (FLOMAX) 0.4 MG CAPS capsule Take 1 capsule (0.4 mg total) by mouth daily. 03/08/19   Lavonia Drafts, MD  traMADol (ULTRAM) 50 MG tablet Take 1 tablet (50 mg total) by mouth every 6 (six) hours as needed. 03/08/19 03/07/20  Lavonia Drafts, MD  verapamil (CALAN-Phillips) 240 MG CR tablet Take by mouth. 09/17/15   [provider]     Allergies Patient has no known allergies.  No family history on file.  Social History Social History   Tobacco Use  . Smoking status: Former Smoker    Quit date: 06/29/1953    Years since quitting: 65.7  . Smokeless tobacco: Never Used  Substance Use Topics  . Alcohol use: No  . Drug use: Not on file    Review of Systems  Constitutional: No fever/chills Eyes: No visual changes.  ENT: No sore throat. Cardiovascular: Denies chest pain. Respiratory: Denies shortness of breath. Gastrointestinal: As above Genitourinary: No hematuria Musculoskeletal: As above Skin: Negative for rash. Neurological: Negative for headaches or weakness    ____________________________________________   PHYSICAL EXAM:  VITAL SIGNS: ED Triage Vitals  Enc Vitals Group     BP 03/08/19 1252 (!) 152/86     Pulse Rate 03/08/19 1252 78     Resp 03/08/19 1252 18     Temp 03/08/19 1252 98.2 F (36.8 C)     Temp Source 03/08/19 1252 Oral     SpO2 03/08/19 1252 98 %     Weight 03/08/19 1253 65.8 kg (145 lb)     Height 03/08/19 1253 1.753 m (5\' 9" )     Head Circumference --      Peak Flow --      Pain Score 03/08/19 1252 7     Pain Loc --      Pain Edu? --      Excl. in [place]? --     Constitutional: Alert and oriented.  Eyes: Conjunctivae are normal.   Nose: No congestion/rhinnorhea. Mouth/Throat: Mucous membranes are moist.    Cardiovascular: Normal rate, regular rhythm. Grossly normal heart sounds.  Good peripheral circulation. Respiratory: Normal respiratory effort.  No retractions. Lungs CTAB. Gastrointestinal: Soft and  nontender. No distention.   Musculoskeletal: Back: Mild left paraspinal tenderness Neurologic:  Normal speech and language. No gross focal neurologic deficits are appreciated.  Skin:  Skin is warm, dry and intact. No rash noted. Psychiatric: Mood and affect are normal. Speech and behavior are normal.  ____________________________________________   LABS (all labs ordered are listed, but only abnormal results are displayed)  Labs Reviewed  URINALYSIS, COMPLETE (UACMP) WITH MICROSCOPIC - Abnormal; Notable for the following components:      Result Value   Color, Urine STRAW (*)    APPearance CLEAR (*)    Hgb urine dipstick MODERATE (*)    Protein, ur 30 (*)    All other components within normal limits  BASIC METABOLIC PANEL - Abnormal; Notable for the following components:   Potassium 5.2 (*)    CO2 16 (*)    Glucose, Bld 107 (*)    BUN 67 (*)    Creatinine, Ser 5.66 (*)    GFR calc non Af Amer 8 (*)    GFR calc Af Amer 10 (*)    All other components within normal limits  CBC - Abnormal; Notable for the  following components:   WBC 15.8 (*)    RBC 3.81 (*)    Hemoglobin 11.2 (*)    HCT 35.0 (*)    All other components within normal limits   ____________________________________________  EKG  None ____________________________________________  RADIOLOGY  CT renal stone study demonstrates mild to moderate left hydronephrosis 5 mm stone proximal left ureter ____________________________________________   PROCEDURES  Procedure(s) performed: No  Procedures   Critical Care performed: No ____________________________________________   INITIAL IMPRESSION / ASSESSMENT AND PLAN / ED COURSE  Pertinent labs & imaging results that were available during my care of the patient were reviewed by me and considered in my medical decision making (see chart for details).  Patient presents with left flank/primarily left back pain as above, some hematuria on urinalysis no evidence of infection.  Pain could be musculoskeletal but could also be related to kidney stone, sent for CT renal stone study.  CT scan confirms kidney stone.  No evidence of infection, appropriate for outpatient follow-up with analgesics    ____________________________________________   FINAL CLINICAL IMPRESSION(S) / ED DIAGNOSES  Final diagnoses:  Kidney stone  Chronic kidney disease, unspecified CKD stage        Note:  This document was prepared using Dragon voice recognition software and may include unintentional dictation errors.   Lavonia Drafts, MD 03/08/19 [place]

## 2019-03-08 NOTE — Discharge Instructions (Addendum)
Please follow up with Dr. Juleen China to evaluate your kidney function. This has been worsening for some time and needs evaluation!  Please schedule an appt with Dr. Bernardo Heater for further evaluation of your kidney stone if you are unable to pass it

## 2019-03-09 ENCOUNTER — Telehealth: Payer: Self-pay | Admitting: Urology

## 2019-03-09 NOTE — Telephone Encounter (Signed)
Pt was seen in the ER yesterday and has a 24mm stone, he is a new pt not sure where to put him. Please advise.

## 2019-03-09 NOTE — Telephone Encounter (Signed)
Spoke to patients wife Asencion Partridge and appointment has been scheduled. I informed her that a 46mm stone usually is passed on its own. They are to call and cancel if he passes the stone.

## 2019-03-14 DIAGNOSIS — E039 Hypothyroidism, unspecified: Secondary | ICD-10-CM | POA: Diagnosis not present

## 2019-03-14 DIAGNOSIS — I10 Essential (primary) hypertension: Secondary | ICD-10-CM | POA: Diagnosis not present

## 2019-03-15 ENCOUNTER — Other Ambulatory Visit: Payer: Self-pay | Admitting: Urology

## 2019-03-15 ENCOUNTER — Encounter: Payer: Self-pay | Admitting: Urology

## 2019-03-15 ENCOUNTER — Ambulatory Visit
Admission: RE | Admit: 2019-03-15 | Discharge: 2019-03-15 | Disposition: A | Discharge status: Home / Self Care | Payer: PPO | Source: Ambulatory Visit | Attending: Urology | Admitting: Urology

## 2019-03-15 ENCOUNTER — Ambulatory Visit: Payer: PPO | Admitting: Urology

## 2019-03-15 ENCOUNTER — Other Ambulatory Visit: Payer: Self-pay

## 2019-03-15 VITALS — BP 145/73 | HR 92 | Ht 69.0 in | Wt 143.0 lb

## 2019-03-15 DIAGNOSIS — N200 Calculus of kidney: Secondary | ICD-10-CM

## 2019-03-15 DIAGNOSIS — N1330 Unspecified hydronephrosis: Secondary | ICD-10-CM | POA: Diagnosis not present

## 2019-03-15 DIAGNOSIS — N184 Chronic kidney disease, stage 4 (severe): Secondary | ICD-10-CM | POA: Diagnosis not present

## 2019-03-15 DIAGNOSIS — N201 Calculus of ureter: Secondary | ICD-10-CM | POA: Diagnosis not present

## 2019-03-15 DIAGNOSIS — R3129 Other microscopic hematuria: Secondary | ICD-10-CM | POA: Diagnosis not present

## 2019-03-15 LAB — URINALYSIS, COMPLETE
Bilirubin, UA: NEGATIVE
Glucose, UA: NEGATIVE
Ketones, UA: NEGATIVE
Leukocytes,UA: NEGATIVE
Nitrite, UA: NEGATIVE
Specific Gravity, UA: <1.005 — ABNORMAL LOW (ref 1.005–1.030)
Urobilinogen, Ur: 0.2 mg/dL (ref 0.2–1.0)
pH, UA: 5 (ref 5.0–7.5)

## 2019-03-15 LAB — MICROSCOPIC EXAMINATION
Bacteria, UA: NONE SEEN
Epithelial Cells (non renal): NONE SEEN /hpf (ref 0–10)

## 2019-03-15 MED ORDER — TAMSULOSIN HCL 0.4 MG PO CAPS: 0.4000 mg | ORAL_CAPSULE | Freq: Every day | ORAL | 0 refills | Status: DC

## 2019-03-15 NOTE — Progress Notes (Signed)
03/15/2019 12:15 PM   Danny Heading Sr. [birth year] [id number]  Referring provider: Juluis Pitch, MD [phone] [place],  [place] [id number]  Chief Complaint  Patient presents with  . Nephrolithiasis    HPI: 85 year old male who presents today for follow-up of a 5 mm obstructing left ureteral calculus.  He is seen and evaluated emergency room on 03/08/2019 with flank pain, nausea.  CT of the abdomen pelvis without contrast was found to have left mild to moderate hydroureteronephrosis with an obstructing 5 x 6 mm stone in the proximal left ureter.  Urinalysis was unremarkable.  He did have a mildly elevated white count of 15.  He has no baseline severe CKD with elevated creatinine to 5.66 during his ED visit, however his baseline is around 4.  He presented this pain is minimal.  This migrated to his left lower quadrant and radiates down to his bladder area.  He also has associated urinary frequency and urgency which is also new.  No fevers or chills.  No nausea or vomiting.  His pain is overall minimal.  His wife today provides most of the history.  She reports that he is passing small gravelly type pieces but not the stone.  He is not been able to strain on every single occasion.  She also notes today that he has been having bowel movements with every urination so is difficult to strain.  He has no prior history of kidney stones.   PMH: Past Medical History:  Diagnosis Date  . Basal cell carcinoma   . Complication of anesthesia    PONV after surg in the 60's.  no issues recently  . COPD (chronic obstructive pulmonary disease) ([place])   . Hearing aid worn    bilateral  . Hemorrhoids   . History of kidney stones   . History of shingles   . Hyperlipidemia   . Hypertension   . Hypothyroidism   . Presence of permanent cardiac pacemaker 08/16/2014   (2nd Pacemaker) Medtronic Adapta Model: ADDR01. Serial #: [id number]  . Prostate cancer ([place])   . RBBB (right bundle branch block)      Surgical History: Past Surgical History:  Procedure Laterality Date  . CATARACT EXTRACTION W/ INTRAOCULAR LENS  IMPLANT, BILATERAL    . ETHMOIDECTOMY Bilateral 10/09/2016   Procedure: ETHMOIDECTOMY;  Surgeon: Beverly Gust, MD;  Location: [facility];  Service: ENT;  Laterality: Bilateral;  . FRONTAL SINUS EXPLORATION Bilateral 10/09/2016   Procedure: FRONTAL SINUS EXPLORATION;  Surgeon: Beverly Gust, MD;  Location: [facility];  Service: ENT;  Laterality: Bilateral;  . HERNIA REPAIR     x3  . IMAGE GUIDED SINUS SURGERY N/A 10/09/2016   Procedure: IMAGE GUIDED SINUS SURGERY;  Surgeon: Beverly Gust, MD;  Location: [facility];  Service: ENT;  Laterality: N/A;  patient has a pacemaker need stryker disk GAVE DISK TO CE[facility] 3-27 KP  . INSERT / REPLACE / REMOVE PACEMAKER  08/16/2014   2nd pacemaker Medtronic Adapta ADDR01 Serial #: [id number]  . MAXILLARY ANTROSTOMY Bilateral 10/09/2016   Procedure: MAXILLARY ANTROSTOMY;  Surgeon: Beverly Gust, MD;  Location: [facility];  Service: ENT;  Laterality: Bilateral;  . NASAL TURBINATE REDUCTION Bilateral 10/09/2016   Procedure: TURBINATE REDUCTION/SUBMUCOSAL RESECTION;  Surgeon: Beverly Gust, MD;  Location: [facility];  Service: ENT;  Laterality: Bilateral;  . PROSTATE SURGERY    . SPHENOIDECTOMY Bilateral 10/09/2016   Procedure: SPHENOIDECTOMY;  Surgeon: Beverly Gust, MD;  Location: [facility];  Service: ENT;  Laterality: Bilateral;  . TONSILLECTOMY AND ADENOIDECTOMY      Home Medications:  Allergies as of 03/15/2019   No Known Allergies     Medication List       Accurate as of March 15, 2019 11:59 PM. If you have any questions, ask your nurse or doctor.        STOP taking these medications   ondansetron 4 MG disintegrating tablet Commonly known as: Zofran ODT Stopped by: Hollice Espy, MD   oxyCODONE-acetaminophen 5-325 MG tablet Commonly known as: Roxicet  Stopped by: Hollice Espy, MD   predniSONE 10 MG (48) Tbpk tablet Commonly known as: STERAPRED UNI-PAK 14 TAB Stopped by: Hollice Espy, MD   sulfamethoxazole-trimethoprim 800-160 MG tablet Commonly known as: BACTRIM DS Stopped by: Hollice Espy, MD   traMADol 50 MG tablet Commonly known as: Ultram Stopped by: Hollice Espy, MD     TAKE these medications   Advair HFA 115-21 MCG/ACT inhaler Generic drug: fluticasone-salmeterol Inhale 2 puffs into the lungs as needed.   Fish Oil 1000 MG Caps Take by mouth.   ibuprofen 200 MG tablet Commonly known as: ADVIL Take 200 mg by mouth every 8 (eight) hours as needed for moderate pain.   levothyroxine 100 MCG tablet Commonly known as: SYNTHROID Take 1 tablet by mouth 1 day or 1 dose.   loratadine 10 MG tablet Commonly known as: CLARITIN Take by mouth.   losartan 100 MG tablet Commonly known as: COZAAR Take by mouth.   lovastatin 40 MG tablet Commonly known as: MEVACOR TAKE 1 TABLET (40 MG TOTAL) BY MOUTH NIGHTLY.   Multi-Vitamins Tabs Take by mouth.   omeprazole 20 MG capsule Commonly known as: PRILOSEC TAKE 1 CAPSULE (20 MG TOTAL) BY MOUTH ONCE DAILY.   tamsulosin 0.4 MG Caps capsule Commonly known as: Flomax Take 1 capsule (0.4 mg total) by mouth daily.   valsartan 160 MG tablet Commonly known as: DIOVAN Take by mouth.   verapamil 240 MG CR tablet Commonly known as: CALAN-SR Take by mouth.       Allergies: No Known Allergies  Family History: No family history on file.  Social History:  reports that he quit smoking about 65 years ago. He has never used smokeless tobacco. He reports that he does not drink alcohol. No history on file for drug.  ROS: UROLOGY Frequent Urination?: Yes Hard to postpone urination?: No Burning/pain with urination?: No Get up at night to urinate?: No Leakage of urine?: No Urine stream starts and stops?: No Trouble starting stream?: No Do you have to strain to  urinate?: No Blood in urine?: Yes Urinary tract infection?: No Sexually transmitted disease?: No Injury to kidneys or bladder?: No Painful intercourse?: No Weak stream?: No Erection problems?: No Penile pain?: No  Gastrointestinal Nausea?: No Vomiting?: No Indigestion/heartburn?: No Diarrhea?: No Constipation?: No  Constitutional Fever: No Night sweats?: No Weight loss?: No Fatigue?: No  Skin Skin rash/lesions?: No Itching?: No  Eyes Blurred vision?: No Double vision?: No  Ears/Nose/Throat Sore throat?: No Sinus problems?: No  Hematologic/Lymphatic Swollen glands?: No Easy bruising?: No  Cardiovascular Leg swelling?: No Chest pain?: No  Respiratory Cough?: No Shortness of breath?: No  Endocrine Excessive thirst?: No  Musculoskeletal Back pain?: No Joint pain?: No  Neurological Headaches?: No Dizziness?: No  Psychologic Depression?: No Anxiety?: No  Physical Exam: BP (!) 145/73   Pulse 92   Ht 5\' 9"  (1.753 m)   Wt 143 lb (64.9 kg)   BMI 21.12  kg/m   Constitutional:  Alert and oriented, No acute distress. HEENT: [place] AT, moist mucus membranes.  Trachea midline, no masses. Cardiovascular: No clubbing, cyanosis, or edema. Respiratory: Normal respiratory effort, no increased work of breathing. GI: Abdomen is soft, nontender, nondistended, no abdominal masses Skin: No rashes, bruises or suspicious lesions. Neurologic: Grossly intact, no focal deficits, moving all 4 extremities. Psychiatric: Normal mood and affect.  Laboratory Data: Lab Results  Component Value Date   WBC 15.8 (H) 03/08/2019   HGB 11.2 (L) 03/08/2019   HCT 35.0 (L) 03/08/2019   MCV 91.9 03/08/2019   PLT 199 03/08/2019    Lab Results  Component Value Date   CREATININE 5.66 (H) 03/08/2019    Urinalysis Results for orders placed or performed in visit on 03/15/19  Microscopic Examination   URINE  Result Value Ref Range   WBC, UA 0-5 0 - 5 /hpf   RBC 3-10 (A) 0 - 2  /hpf   Epithelial Cells (non renal) None seen 0 - 10 /hpf   Bacteria, UA None seen None seen/Few  Urinalysis, Complete  Result Value Ref Range   Specific Gravity, UA <1.005 (L) 1.005 - 1.030   pH, UA 5.0 5.0 - 7.5   Color, UA Yellow Yellow   Appearance Ur Clear Clear   Leukocytes,UA Negative Negative   Protein,UA 1+ (A) Negative/Trace   Glucose, UA Negative Negative   Ketones, UA Negative Negative   RBC, UA 3+ (A) Negative   Bilirubin, UA Negative Negative   Urobilinogen, Ur 0.2 0.2 - 1.0 mg/dL   Nitrite, UA Negative Negative   Microscopic Examination See below:     Pertinent Imaging: Results for orders placed during the hospital encounter of 03/08/19  CT RENAL STONE STUDY   Narrative CLINICAL DATA:  Left-sided flank pain  EXAM: CT ABDOMEN AND PELVIS WITHOUT CONTRAST  TECHNIQUE: Multidetector CT imaging of the abdomen and pelvis was performed following the standard protocol without IV contrast.  COMPARISON:  CT chest 10/01/2016  FINDINGS: Lower chest: Lung bases demonstrate small right-sided pleural effusion. No acute consolidation. Stable 7 mm right lung base nodule. Partially visualized intracardiac pacing lead. Small hiatal hernia  Hepatobiliary: Subcentimeter hypodensity in the left hepatic dome, too small to further characterize. Calcified granulomas.  Pancreas: Unremarkable. No pancreatic ductal dilatation or surrounding inflammatory changes.  Spleen: Normal in size without focal abnormality.  Adrenals/Urinary Tract: Adrenal glands are normal. Cyst mid left kidney. Mild to moderate left hydronephrosis and proximal hydroureter secondary to a 5 x 6 mm stone in the proximal left ureter about 2.5 cm distal to the left UPJ. Punctate intrarenal stones within both kidneys. Bladder is empty  Stomach/Bowel: Stomach is within normal limits. Appendix appears normal. No evidence of bowel wall thickening, distention, or inflammatory changes. Sigmoid colon diverticula  without acute inflammatory change.  Vascular/Lymphatic: Moderate aortic atherosclerosis. No aneurysm. No significantly enlarged lymph nodes.  Reproductive: Postsurgical changes at the prostate.  Other: No free air or free fluid. Left inguinal hernia contains fat, the upper sac contains a small portion of sigmoid colon and diverticula.  Musculoskeletal: No acute or suspicious osseous abnormality.  IMPRESSION: 1. Mild to moderate left hydronephrosis and proximal hydroureter, secondary to a 5 x 6 mm stone in the proximal left ureter about 2.5 cm distal to the left UPJ 2. Punctate stones within the kidneys 3. Sigmoid colon diverticular disease without acute inflammatory change 4. Left inguinal hernia containing and small amount of sigmoid colon   Electronically Signed   By:  Donavan Foil M.D.   On: 03/08/2019 15:40    CT abdomen pelvis was personally reviewed.  Agree with radiologic interpretation.  KUB was also reviewed today.  Stone was not visualized on x-ray.  Assessment & Plan:    1. Left ureteral stone 5 mm left ureteral calculus  Based on his symptoms, I feel that the stone is likely migrated close to the UVJ but I suspect it is retained at this point in time based on his lower left abdominal discomfort albeit mild, as well as microscopic blood in his urine today.  We discussed options today.  Given that the stone is not visible on x-ray, ESWL is not an optimal choice.  Alternatively, ureteroscopy was discussed.  He would like to avoid anesthesia if at all possible and surgical intervention.  Given size of stone, he does have a good chance of passing it spontaneously.  He would like to continue medical expulsive therapy.  Flomax refilled.  He was given a urinary strainer.  Careful warning symptoms reviewed in detail including signs and symptoms of more urgent/emergent evaluation.  Both he and his wife is agreeable this plan.  We will follow him closely with follow-up  renal ultrasound in 2 weeks.  In the interim, he will continue to strain his urine. - Urinalysis, Complete - US RENAL; Future  2. Hydronephrosis of left kidney Secondary #1  3. Microscopic hematuria Presumably secondary to #1  4. CKD (chronic kidney disease) stage 4, GFR 15-29 ml/min (HCC) Stage IV/V chronic CKD with slight exacerbation likely from unilateral obstruction    Return in about 2 weeks (around 03/29/2019) for RUS with [place], [place] [address], [place], [place] [address] [phone]

## 2019-03-20 ENCOUNTER — Telehealth: Payer: Self-pay | Admitting: Urology

## 2019-03-20 NOTE — Telephone Encounter (Signed)
Pt.'s wife states he passed stone on Thursday, urine is clear and she wants to know if he needs to keep his appointment with Sam on 03/31/19 with a RUS prior? Please advise.

## 2019-03-20 NOTE — Telephone Encounter (Signed)
I just spoke with the patient's wife via telephone.  She reports that he passed a stone late Thursday evening.  His pain has since resolved.  She denies gross hematuria.  She is curious if they should keep his scheduled renal ultrasound on 9/30 and follow-up clinic appointment on 10/2 in light of this.  I advised her that I would like them to keep his scheduled renal ultrasound to assess for resolution of his hydronephrosis.  I told her that I would review these results prior to his scheduled appointment with me.  Based upon these results, he may only need a lab visit for a drop off UA to prove resolution of his microscopic hematuria.  Otherwise, if his hydronephrosis persists, I will plan to see him in clinic.  She expressed understanding.  Additionally, I advised her to continue tamsulosin for two more days and stop it thereafter.

## 2019-03-29 ENCOUNTER — Other Ambulatory Visit: Payer: Self-pay

## 2019-03-29 ENCOUNTER — Ambulatory Visit
Admission: RE | Admit: 2019-03-29 | Discharge: 2019-03-29 | Disposition: A | Discharge status: Home / Self Care | Payer: PPO | Source: Ambulatory Visit | Attending: Urology | Admitting: Urology

## 2019-03-29 DIAGNOSIS — N201 Calculus of ureter: Secondary | ICD-10-CM | POA: Diagnosis not present

## 2019-03-29 DIAGNOSIS — N281 Cyst of kidney, acquired: Secondary | ICD-10-CM | POA: Diagnosis not present

## 2019-03-30 ENCOUNTER — Other Ambulatory Visit (INDEPENDENT_AMBULATORY_CARE_PROVIDER_SITE_OTHER): Payer: PPO | Admitting: Physician Assistant

## 2019-03-30 DIAGNOSIS — R3129 Other microscopic hematuria: Secondary | ICD-10-CM

## 2019-03-30 NOTE — Progress Notes (Signed)
Patient's renal ultrasound revealed resolution of hydronephrosis following passage of a proximal left ureteral stone.  I just spoke with his wife over the telephone and counseled her that he does not need to follow-up with me in clinic tomorrow, however I still do need him to drop off a urine to prove resolution of his microscopic hematuria.  She has asked to come in on Monday for lab visit for this.  I am in agreement with this plan.

## 2019-03-31 ENCOUNTER — Ambulatory Visit: Payer: PPO | Admitting: Physician Assistant

## 2019-04-03 ENCOUNTER — Other Ambulatory Visit: Payer: Self-pay

## 2019-04-03 ENCOUNTER — Other Ambulatory Visit: Payer: PPO

## 2019-04-03 DIAGNOSIS — R3129 Other microscopic hematuria: Secondary | ICD-10-CM | POA: Diagnosis not present

## 2019-04-04 LAB — URINALYSIS, COMPLETE
Bilirubin, UA: NEGATIVE
Glucose, UA: NEGATIVE
Ketones, UA: NEGATIVE
Leukocytes,UA: NEGATIVE
Nitrite, UA: NEGATIVE
Specific Gravity, UA: 1.015 (ref 1.005–1.030)
Urobilinogen, Ur: 0.2 mg/dL (ref 0.2–1.0)
pH, UA: 5.5 (ref 5.0–7.5)

## 2019-04-04 LAB — MICROSCOPIC EXAMINATION
Bacteria, UA: NONE SEEN
Epithelial Cells (non renal): NONE SEEN /hpf (ref 0–10)
RBC: NONE SEEN /hpf (ref 0–2)

## 2019-04-04 NOTE — Progress Notes (Signed)
Microscopic hematuria resolved following passage of a kidney stone.  No hematuria evaluation needed.  Notified patient.

## 2019-04-06 ENCOUNTER — Other Ambulatory Visit: Payer: Self-pay | Admitting: Urology

## 2019-04-06 DIAGNOSIS — N201 Calculus of ureter: Secondary | ICD-10-CM

## 2019-04-10 DIAGNOSIS — N200 Calculus of kidney: Secondary | ICD-10-CM | POA: Diagnosis not present

## 2019-04-10 DIAGNOSIS — I120 Hypertensive chronic kidney disease with stage 5 chronic kidney disease or end stage renal disease: Secondary | ICD-10-CM | POA: Diagnosis not present

## 2019-04-10 DIAGNOSIS — E872 Acidosis: Secondary | ICD-10-CM | POA: Diagnosis not present

## 2019-04-10 DIAGNOSIS — R319 Hematuria, unspecified: Secondary | ICD-10-CM | POA: Diagnosis not present

## 2019-04-10 DIAGNOSIS — R809 Proteinuria, unspecified: Secondary | ICD-10-CM | POA: Diagnosis not present

## 2019-04-10 DIAGNOSIS — N185 Chronic kidney disease, stage 5: Secondary | ICD-10-CM | POA: Diagnosis not present

## 2019-04-10 DIAGNOSIS — N2581 Secondary hyperparathyroidism of renal origin: Secondary | ICD-10-CM | POA: Diagnosis not present

## 2019-04-10 DIAGNOSIS — D631 Anemia in chronic kidney disease: Secondary | ICD-10-CM | POA: Diagnosis not present

## 2019-05-16 DIAGNOSIS — N185 Chronic kidney disease, stage 5: Secondary | ICD-10-CM | POA: Diagnosis not present

## 2019-05-16 DIAGNOSIS — N2581 Secondary hyperparathyroidism of renal origin: Secondary | ICD-10-CM | POA: Diagnosis not present

## 2019-05-16 DIAGNOSIS — D631 Anemia in chronic kidney disease: Secondary | ICD-10-CM | POA: Diagnosis not present

## 2019-05-16 DIAGNOSIS — R809 Proteinuria, unspecified: Secondary | ICD-10-CM | POA: Diagnosis not present

## 2019-05-16 DIAGNOSIS — N200 Calculus of kidney: Secondary | ICD-10-CM | POA: Diagnosis not present

## 2019-05-16 DIAGNOSIS — R319 Hematuria, unspecified: Secondary | ICD-10-CM | POA: Diagnosis not present

## 2019-05-16 DIAGNOSIS — I120 Hypertensive chronic kidney disease with stage 5 chronic kidney disease or end stage renal disease: Secondary | ICD-10-CM | POA: Diagnosis not present

## 2019-05-16 DIAGNOSIS — E872 Acidosis: Secondary | ICD-10-CM | POA: Diagnosis not present

## 2019-06-06 DIAGNOSIS — E872 Acidosis: Secondary | ICD-10-CM | POA: Diagnosis not present

## 2019-06-06 DIAGNOSIS — R319 Hematuria, unspecified: Secondary | ICD-10-CM | POA: Diagnosis not present

## 2019-06-06 DIAGNOSIS — D631 Anemia in chronic kidney disease: Secondary | ICD-10-CM | POA: Diagnosis not present

## 2019-06-06 DIAGNOSIS — I120 Hypertensive chronic kidney disease with stage 5 chronic kidney disease or end stage renal disease: Secondary | ICD-10-CM | POA: Diagnosis not present

## 2019-06-06 DIAGNOSIS — R809 Proteinuria, unspecified: Secondary | ICD-10-CM | POA: Diagnosis not present

## 2019-06-06 DIAGNOSIS — N200 Calculus of kidney: Secondary | ICD-10-CM | POA: Diagnosis not present

## 2019-06-06 DIAGNOSIS — N2581 Secondary hyperparathyroidism of renal origin: Secondary | ICD-10-CM | POA: Diagnosis not present

## 2019-06-06 DIAGNOSIS — N185 Chronic kidney disease, stage 5: Secondary | ICD-10-CM | POA: Diagnosis not present

## 2019-06-13 DIAGNOSIS — N2581 Secondary hyperparathyroidism of renal origin: Secondary | ICD-10-CM | POA: Diagnosis not present

## 2019-06-13 DIAGNOSIS — R809 Proteinuria, unspecified: Secondary | ICD-10-CM | POA: Diagnosis not present

## 2019-06-13 DIAGNOSIS — N185 Chronic kidney disease, stage 5: Secondary | ICD-10-CM | POA: Diagnosis not present

## 2019-06-13 DIAGNOSIS — R319 Hematuria, unspecified: Secondary | ICD-10-CM | POA: Diagnosis not present

## 2019-06-13 DIAGNOSIS — I120 Hypertensive chronic kidney disease with stage 5 chronic kidney disease or end stage renal disease: Secondary | ICD-10-CM | POA: Diagnosis not present

## 2019-06-13 DIAGNOSIS — E875 Hyperkalemia: Secondary | ICD-10-CM | POA: Diagnosis not present

## 2019-06-13 DIAGNOSIS — E872 Acidosis: Secondary | ICD-10-CM | POA: Diagnosis not present

## 2019-07-13 DIAGNOSIS — R319 Hematuria, unspecified: Secondary | ICD-10-CM | POA: Diagnosis not present

## 2019-07-13 DIAGNOSIS — I120 Hypertensive chronic kidney disease with stage 5 chronic kidney disease or end stage renal disease: Secondary | ICD-10-CM | POA: Diagnosis not present

## 2019-07-13 DIAGNOSIS — N185 Chronic kidney disease, stage 5: Secondary | ICD-10-CM | POA: Diagnosis not present

## 2019-07-13 DIAGNOSIS — E872 Acidosis: Secondary | ICD-10-CM | POA: Diagnosis not present

## 2019-07-13 DIAGNOSIS — N2581 Secondary hyperparathyroidism of renal origin: Secondary | ICD-10-CM | POA: Diagnosis not present

## 2019-07-13 DIAGNOSIS — E875 Hyperkalemia: Secondary | ICD-10-CM | POA: Diagnosis not present

## 2019-07-13 DIAGNOSIS — R809 Proteinuria, unspecified: Secondary | ICD-10-CM | POA: Diagnosis not present

## 2019-07-19 DIAGNOSIS — N184 Chronic kidney disease, stage 4 (severe): Secondary | ICD-10-CM | POA: Diagnosis not present

## 2019-07-19 DIAGNOSIS — K219 Gastro-esophageal reflux disease without esophagitis: Secondary | ICD-10-CM | POA: Diagnosis not present

## 2019-07-19 DIAGNOSIS — E785 Hyperlipidemia, unspecified: Secondary | ICD-10-CM | POA: Diagnosis not present

## 2019-07-19 DIAGNOSIS — R0602 Shortness of breath: Secondary | ICD-10-CM | POA: Diagnosis not present

## 2019-07-19 DIAGNOSIS — E039 Hypothyroidism, unspecified: Secondary | ICD-10-CM | POA: Diagnosis not present

## 2019-07-19 DIAGNOSIS — F0390 Unspecified dementia without behavioral disturbance: Secondary | ICD-10-CM | POA: Diagnosis not present

## 2019-07-19 DIAGNOSIS — I10 Essential (primary) hypertension: Secondary | ICD-10-CM | POA: Diagnosis not present

## 2019-07-20 DIAGNOSIS — I120 Hypertensive chronic kidney disease with stage 5 chronic kidney disease or end stage renal disease: Secondary | ICD-10-CM | POA: Diagnosis not present

## 2019-07-20 DIAGNOSIS — N2581 Secondary hyperparathyroidism of renal origin: Secondary | ICD-10-CM | POA: Diagnosis not present

## 2019-07-20 DIAGNOSIS — N185 Chronic kidney disease, stage 5: Secondary | ICD-10-CM | POA: Diagnosis not present

## 2019-07-20 DIAGNOSIS — E872 Acidosis: Secondary | ICD-10-CM | POA: Diagnosis not present

## 2019-07-20 DIAGNOSIS — E875 Hyperkalemia: Secondary | ICD-10-CM | POA: Diagnosis not present

## 2019-07-20 DIAGNOSIS — R319 Hematuria, unspecified: Secondary | ICD-10-CM | POA: Diagnosis not present

## 2019-07-20 DIAGNOSIS — R809 Proteinuria, unspecified: Secondary | ICD-10-CM | POA: Diagnosis not present

## 2019-07-20 DIAGNOSIS — D631 Anemia in chronic kidney disease: Secondary | ICD-10-CM | POA: Diagnosis not present

## 2019-08-23 DIAGNOSIS — R809 Proteinuria, unspecified: Secondary | ICD-10-CM | POA: Diagnosis not present

## 2019-08-23 DIAGNOSIS — D631 Anemia in chronic kidney disease: Secondary | ICD-10-CM | POA: Diagnosis not present

## 2019-08-23 DIAGNOSIS — E872 Acidosis: Secondary | ICD-10-CM | POA: Diagnosis not present

## 2019-08-23 DIAGNOSIS — N185 Chronic kidney disease, stage 5: Secondary | ICD-10-CM | POA: Diagnosis not present

## 2019-08-23 DIAGNOSIS — R319 Hematuria, unspecified: Secondary | ICD-10-CM | POA: Diagnosis not present

## 2019-08-23 DIAGNOSIS — E875 Hyperkalemia: Secondary | ICD-10-CM | POA: Diagnosis not present

## 2019-08-23 DIAGNOSIS — I120 Hypertensive chronic kidney disease with stage 5 chronic kidney disease or end stage renal disease: Secondary | ICD-10-CM | POA: Diagnosis not present

## 2019-08-23 DIAGNOSIS — N2581 Secondary hyperparathyroidism of renal origin: Secondary | ICD-10-CM | POA: Diagnosis not present

## 2019-08-30 DIAGNOSIS — N200 Calculus of kidney: Secondary | ICD-10-CM | POA: Diagnosis not present

## 2019-08-30 DIAGNOSIS — N2581 Secondary hyperparathyroidism of renal origin: Secondary | ICD-10-CM | POA: Diagnosis not present

## 2019-08-30 DIAGNOSIS — E872 Acidosis: Secondary | ICD-10-CM | POA: Diagnosis not present

## 2019-08-30 DIAGNOSIS — D631 Anemia in chronic kidney disease: Secondary | ICD-10-CM | POA: Diagnosis not present

## 2019-08-30 DIAGNOSIS — R319 Hematuria, unspecified: Secondary | ICD-10-CM | POA: Diagnosis not present

## 2019-08-30 DIAGNOSIS — N185 Chronic kidney disease, stage 5: Secondary | ICD-10-CM | POA: Diagnosis not present

## 2019-08-30 DIAGNOSIS — R809 Proteinuria, unspecified: Secondary | ICD-10-CM | POA: Diagnosis not present

## 2019-08-30 DIAGNOSIS — E875 Hyperkalemia: Secondary | ICD-10-CM | POA: Diagnosis not present

## 2019-08-30 DIAGNOSIS — I120 Hypertensive chronic kidney disease with stage 5 chronic kidney disease or end stage renal disease: Secondary | ICD-10-CM | POA: Diagnosis not present

## 2019-09-12 DIAGNOSIS — I495 Sick sinus syndrome: Secondary | ICD-10-CM | POA: Diagnosis not present

## 2019-10-11 DIAGNOSIS — N200 Calculus of kidney: Secondary | ICD-10-CM | POA: Diagnosis not present

## 2019-10-11 DIAGNOSIS — E872 Acidosis: Secondary | ICD-10-CM | POA: Diagnosis not present

## 2019-10-11 DIAGNOSIS — E875 Hyperkalemia: Secondary | ICD-10-CM | POA: Diagnosis not present

## 2019-10-11 DIAGNOSIS — R319 Hematuria, unspecified: Secondary | ICD-10-CM | POA: Diagnosis not present

## 2019-10-11 DIAGNOSIS — N2581 Secondary hyperparathyroidism of renal origin: Secondary | ICD-10-CM | POA: Diagnosis not present

## 2019-10-11 DIAGNOSIS — D631 Anemia in chronic kidney disease: Secondary | ICD-10-CM | POA: Diagnosis not present

## 2019-10-11 DIAGNOSIS — N185 Chronic kidney disease, stage 5: Secondary | ICD-10-CM | POA: Diagnosis not present

## 2019-10-11 DIAGNOSIS — I120 Hypertensive chronic kidney disease with stage 5 chronic kidney disease or end stage renal disease: Secondary | ICD-10-CM | POA: Diagnosis not present

## 2019-10-11 DIAGNOSIS — R809 Proteinuria, unspecified: Secondary | ICD-10-CM | POA: Diagnosis not present

## 2019-10-18 DIAGNOSIS — N2581 Secondary hyperparathyroidism of renal origin: Secondary | ICD-10-CM | POA: Diagnosis not present

## 2019-10-18 DIAGNOSIS — E875 Hyperkalemia: Secondary | ICD-10-CM | POA: Diagnosis not present

## 2019-10-18 DIAGNOSIS — R319 Hematuria, unspecified: Secondary | ICD-10-CM | POA: Diagnosis not present

## 2019-10-18 DIAGNOSIS — D631 Anemia in chronic kidney disease: Secondary | ICD-10-CM | POA: Diagnosis not present

## 2019-10-18 DIAGNOSIS — E872 Acidosis: Secondary | ICD-10-CM | POA: Diagnosis not present

## 2019-10-18 DIAGNOSIS — N185 Chronic kidney disease, stage 5: Secondary | ICD-10-CM | POA: Diagnosis not present

## 2019-10-18 DIAGNOSIS — I120 Hypertensive chronic kidney disease with stage 5 chronic kidney disease or end stage renal disease: Secondary | ICD-10-CM | POA: Diagnosis not present

## 2019-10-18 DIAGNOSIS — R809 Proteinuria, unspecified: Secondary | ICD-10-CM | POA: Diagnosis not present

## 2019-12-18 DIAGNOSIS — Z8546 Personal history of malignant neoplasm of prostate: Secondary | ICD-10-CM | POA: Diagnosis not present

## 2019-12-18 DIAGNOSIS — N184 Chronic kidney disease, stage 4 (severe): Secondary | ICD-10-CM | POA: Diagnosis not present

## 2019-12-18 DIAGNOSIS — E039 Hypothyroidism, unspecified: Secondary | ICD-10-CM | POA: Diagnosis not present

## 2019-12-18 DIAGNOSIS — E785 Hyperlipidemia, unspecified: Secondary | ICD-10-CM | POA: Diagnosis not present

## 2019-12-18 DIAGNOSIS — I10 Essential (primary) hypertension: Secondary | ICD-10-CM | POA: Diagnosis not present

## 2020-01-30 DIAGNOSIS — E039 Hypothyroidism, unspecified: Secondary | ICD-10-CM | POA: Diagnosis not present

## 2020-03-12 DIAGNOSIS — I495 Sick sinus syndrome: Secondary | ICD-10-CM | POA: Diagnosis not present

## 2020-03-13 DIAGNOSIS — I10 Essential (primary) hypertension: Secondary | ICD-10-CM | POA: Diagnosis not present

## 2020-03-13 DIAGNOSIS — E039 Hypothyroidism, unspecified: Secondary | ICD-10-CM | POA: Diagnosis not present

## 2020-04-10 DIAGNOSIS — I495 Sick sinus syndrome: Secondary | ICD-10-CM | POA: Diagnosis not present

## 2020-05-21 DIAGNOSIS — Z79899 Other long term (current) drug therapy: Secondary | ICD-10-CM | POA: Diagnosis not present

## 2020-05-21 DIAGNOSIS — E039 Hypothyroidism, unspecified: Secondary | ICD-10-CM | POA: Diagnosis not present

## 2020-07-08 DIAGNOSIS — Z0000 Encounter for general adult medical examination without abnormal findings: Secondary | ICD-10-CM | POA: Diagnosis not present

## 2020-07-08 DIAGNOSIS — E785 Hyperlipidemia, unspecified: Secondary | ICD-10-CM | POA: Diagnosis not present

## 2020-07-08 DIAGNOSIS — I10 Essential (primary) hypertension: Secondary | ICD-10-CM | POA: Diagnosis not present

## 2020-07-08 DIAGNOSIS — R350 Frequency of micturition: Secondary | ICD-10-CM | POA: Diagnosis not present

## 2020-07-08 DIAGNOSIS — J42 Unspecified chronic bronchitis: Secondary | ICD-10-CM | POA: Diagnosis not present

## 2020-07-08 DIAGNOSIS — E039 Hypothyroidism, unspecified: Secondary | ICD-10-CM | POA: Diagnosis not present

## 2020-07-08 DIAGNOSIS — N184 Chronic kidney disease, stage 4 (severe): Secondary | ICD-10-CM | POA: Diagnosis not present

## 2020-07-08 DIAGNOSIS — Z8546 Personal history of malignant neoplasm of prostate: Secondary | ICD-10-CM | POA: Diagnosis not present

## 2020-07-24 DIAGNOSIS — N184 Chronic kidney disease, stage 4 (severe): Secondary | ICD-10-CM | POA: Diagnosis not present

## 2020-07-24 DIAGNOSIS — R350 Frequency of micturition: Secondary | ICD-10-CM | POA: Diagnosis not present

## 2020-09-02 DIAGNOSIS — J209 Acute bronchitis, unspecified: Secondary | ICD-10-CM | POA: Diagnosis not present

## 2020-09-02 DIAGNOSIS — J440 Chronic obstructive pulmonary disease with acute lower respiratory infection: Secondary | ICD-10-CM | POA: Diagnosis not present

## 2020-09-10 DIAGNOSIS — I495 Sick sinus syndrome: Secondary | ICD-10-CM | POA: Diagnosis not present

## 2020-10-20 DIAGNOSIS — I459 Conduction disorder, unspecified: Secondary | ICD-10-CM | POA: Diagnosis not present

## 2020-10-20 DIAGNOSIS — Z515 Encounter for palliative care: Secondary | ICD-10-CM | POA: Diagnosis not present

## 2020-10-20 DIAGNOSIS — G9389 Other specified disorders of brain: Secondary | ICD-10-CM | POA: Diagnosis not present

## 2020-10-20 DIAGNOSIS — I639 Cerebral infarction, unspecified: Secondary | ICD-10-CM | POA: Diagnosis not present

## 2020-10-20 DIAGNOSIS — I442 Atrioventricular block, complete: Secondary | ICD-10-CM | POA: Diagnosis not present

## 2020-10-20 DIAGNOSIS — R531 Weakness: Secondary | ICD-10-CM | POA: Diagnosis not present

## 2020-10-20 DIAGNOSIS — Z20822 Contact with and (suspected) exposure to covid-19: Secondary | ICD-10-CM | POA: Diagnosis not present

## 2020-10-20 DIAGNOSIS — F0390 Unspecified dementia without behavioral disturbance: Secondary | ICD-10-CM | POA: Diagnosis not present

## 2020-10-20 DIAGNOSIS — E039 Hypothyroidism, unspecified: Secondary | ICD-10-CM | POA: Diagnosis not present

## 2020-10-20 DIAGNOSIS — I63411 Cerebral infarction due to embolism of right middle cerebral artery: Secondary | ICD-10-CM | POA: Diagnosis not present

## 2020-10-20 DIAGNOSIS — I63511 Cerebral infarction due to unspecified occlusion or stenosis of right middle cerebral artery: Secondary | ICD-10-CM | POA: Diagnosis not present

## 2020-10-20 DIAGNOSIS — J324 Chronic pansinusitis: Secondary | ICD-10-CM | POA: Diagnosis not present

## 2020-10-20 DIAGNOSIS — R29810 Facial weakness: Secondary | ICD-10-CM | POA: Diagnosis not present

## 2020-10-20 DIAGNOSIS — R404 Transient alteration of awareness: Secondary | ICD-10-CM | POA: Diagnosis not present

## 2020-10-20 DIAGNOSIS — Z66 Do not resuscitate: Secondary | ICD-10-CM | POA: Diagnosis not present

## 2020-10-20 DIAGNOSIS — I120 Hypertensive chronic kidney disease with stage 5 chronic kidney disease or end stage renal disease: Secondary | ICD-10-CM | POA: Diagnosis not present

## 2020-10-20 DIAGNOSIS — Z950 Presence of cardiac pacemaker: Secondary | ICD-10-CM | POA: Diagnosis not present

## 2020-10-20 DIAGNOSIS — R29703 NIHSS score 3: Secondary | ICD-10-CM | POA: Diagnosis not present

## 2020-10-20 DIAGNOSIS — J449 Chronic obstructive pulmonary disease, unspecified: Secondary | ICD-10-CM | POA: Diagnosis not present

## 2020-10-20 DIAGNOSIS — R0600 Dyspnea, unspecified: Secondary | ICD-10-CM | POA: Diagnosis not present

## 2020-10-20 DIAGNOSIS — R000 Tachycardia, unspecified: Secondary | ICD-10-CM | POA: Diagnosis not present

## 2020-10-20 DIAGNOSIS — R40241 Glasgow coma scale score 13-15, unspecified time: Secondary | ICD-10-CM | POA: Diagnosis not present

## 2020-10-20 DIAGNOSIS — R410 Disorientation, unspecified: Secondary | ICD-10-CM | POA: Diagnosis not present

## 2020-10-20 DIAGNOSIS — K219 Gastro-esophageal reflux disease without esophagitis: Secondary | ICD-10-CM | POA: Diagnosis not present

## 2020-10-20 DIAGNOSIS — N185 Chronic kidney disease, stage 5: Secondary | ICD-10-CM | POA: Diagnosis not present

## 2020-10-20 DIAGNOSIS — E785 Hyperlipidemia, unspecified: Secondary | ICD-10-CM | POA: Diagnosis not present

## 2020-10-20 DIAGNOSIS — I10 Essential (primary) hypertension: Secondary | ICD-10-CM | POA: Diagnosis not present

## 2020-11-27 DEATH — deceased

## 2021-04-26 IMAGING — CT CT RENAL STONE PROTOCOL
2 of 4 series · 16 of 46 positions shown, 18 images · non-contrast
Comparison: CT chest 10/01/2016

CLINICAL DATA: Left-sided flank pain

EXAM:
CT ABDOMEN AND PELVIS WITHOUT CONTRAST
TECHNIQUE: Multidetector CT imaging of the abdomen and pelvis was performed
following the standard protocol without IV contrast.

[Series 2: stone full standard · axial · 0.70mm/px · z∈[-683,-318]mm · 13 of 81 slices shown, 15 images]
[im 4/81  soft-tissue]
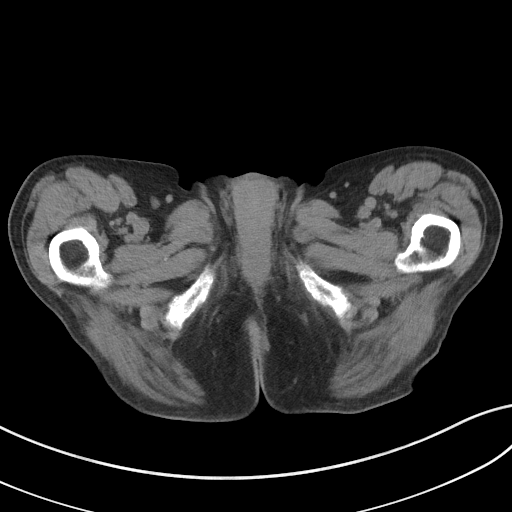
[im 4/81  bone]
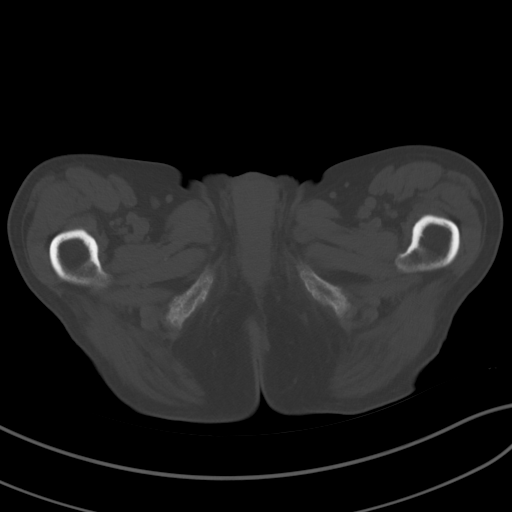
[im 11/81  soft-tissue]
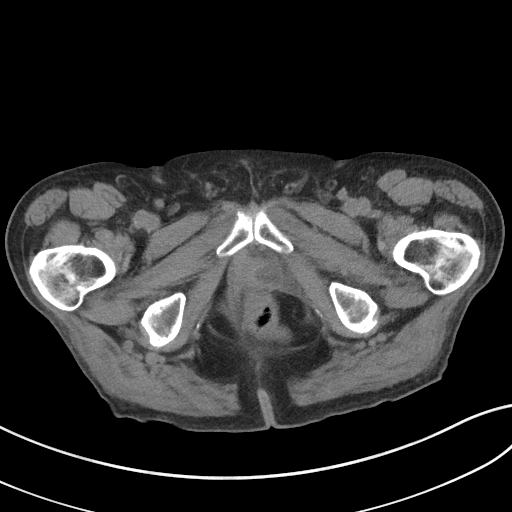
[im 17/81  soft-tissue]
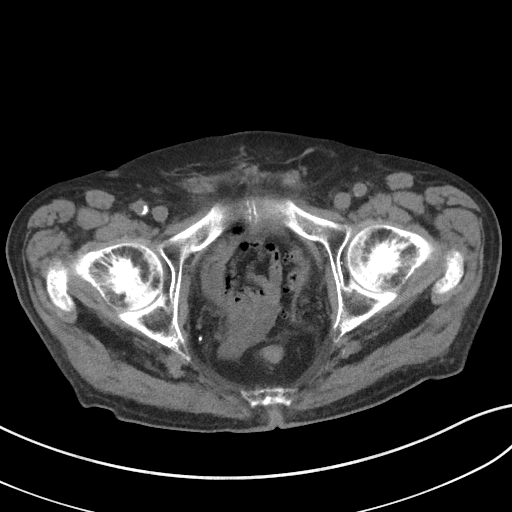
[im 24/81  soft-tissue]
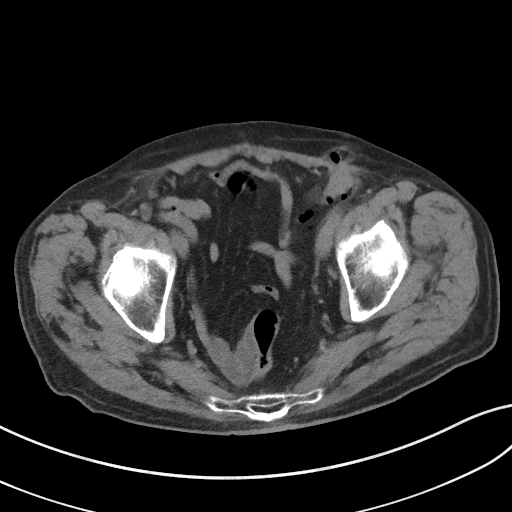
[im 27/81  soft-tissue]
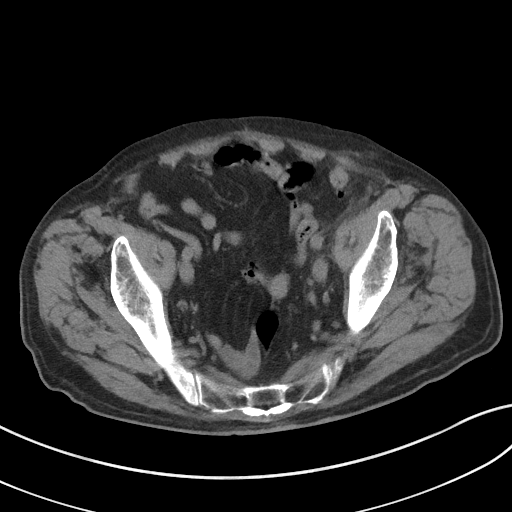
[im 34/81  soft-tissue]
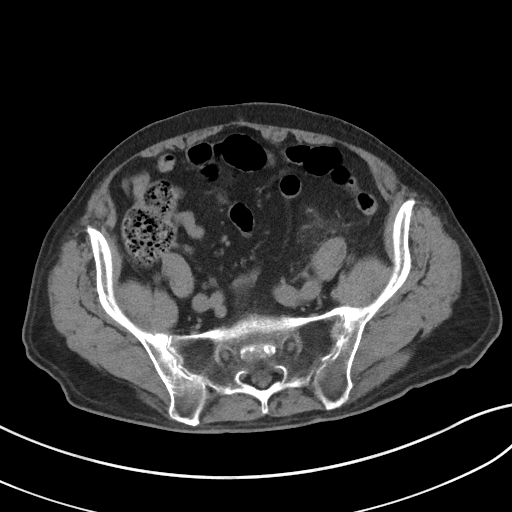
[im 41/81  soft-tissue]
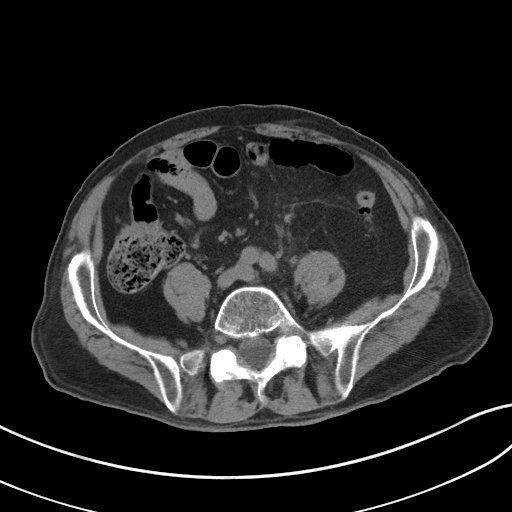
[im 47/81  soft-tissue]
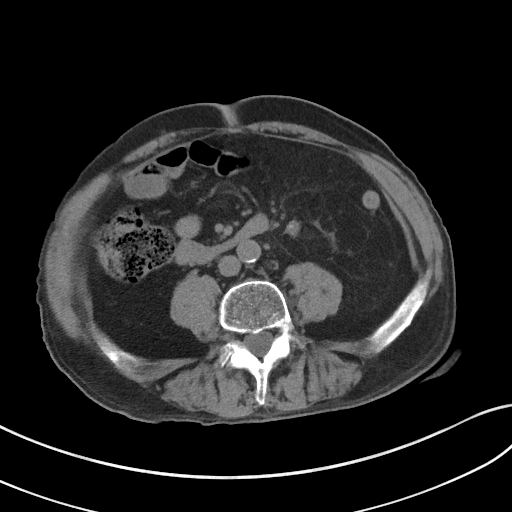
[im 54/81  soft-tissue]
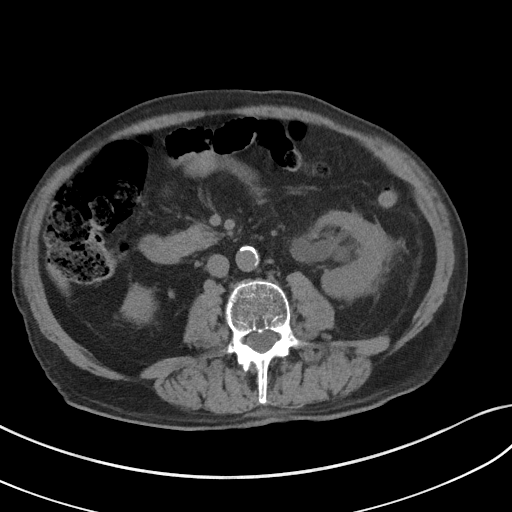
[im 54/81  bone]
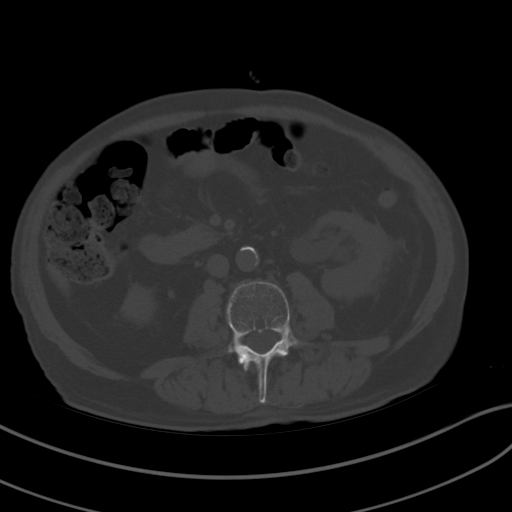
[im 57/81  soft-tissue]
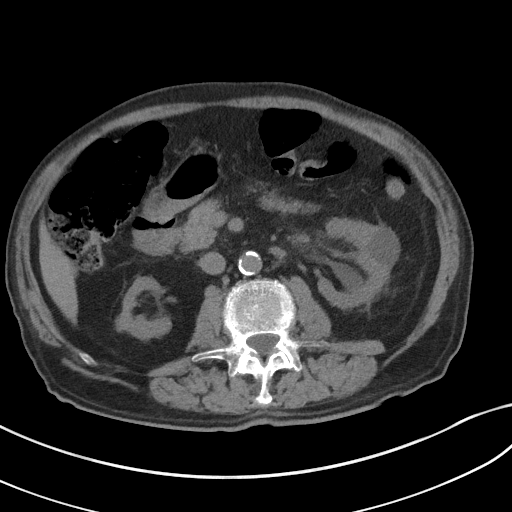
[im 64/81  soft-tissue]
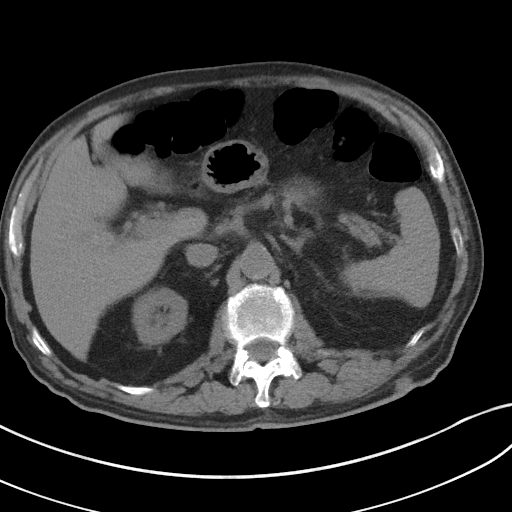
[im 71/81  soft-tissue]
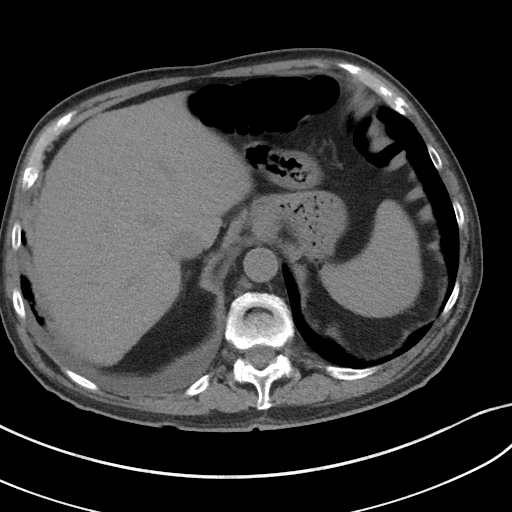
[im 77/81  soft-tissue]
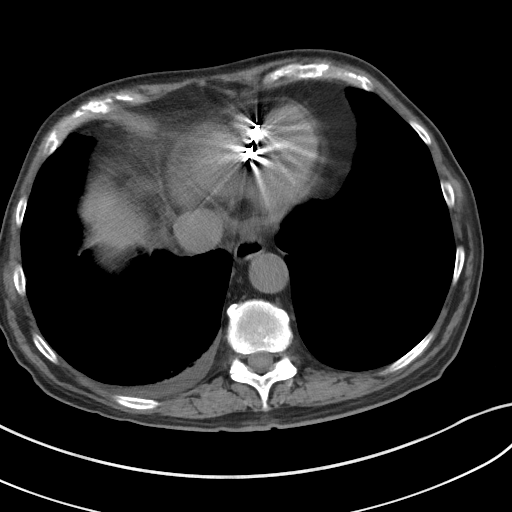

[Series 5: coronal · coronal · 0.79mm/px · 3 of 124 slices shown]
[im 42/124  soft-tissue]
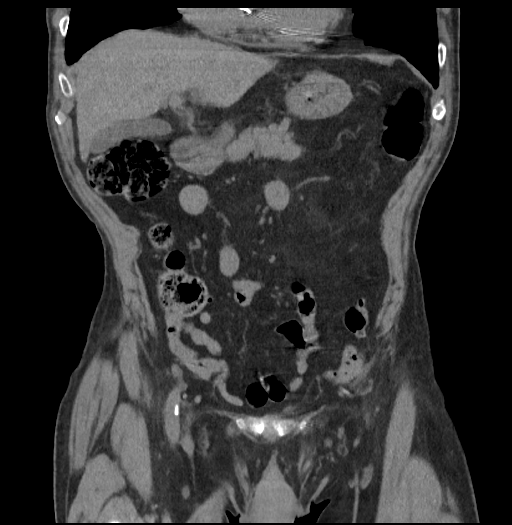
[im 55/124  soft-tissue]
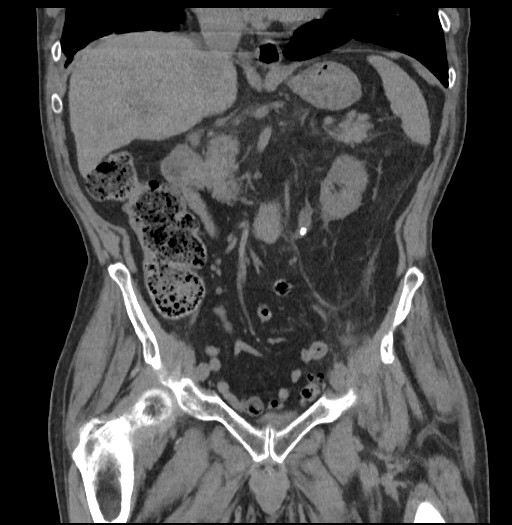
[im 69/124  soft-tissue]
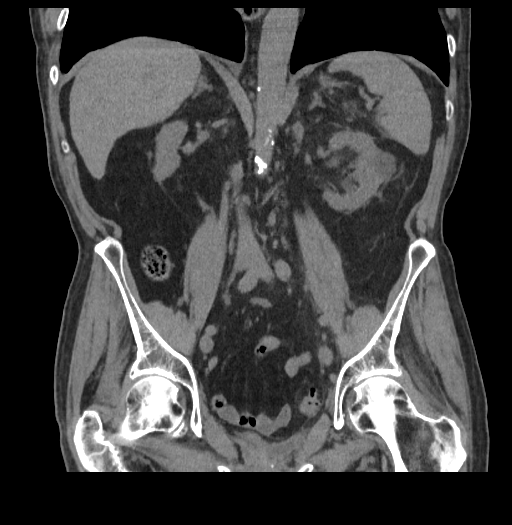

[16 of 46 positions shown; findings below may reference images not displayed]

FINDINGS: Lower chest: Lung bases demonstrate small right-sided pleural
effusion. No acute consolidation. Stable 7 mm right lung base
nodule. Partially visualized intracardiac pacing lead. Small hiatal
hernia

Hepatobiliary: Subcentimeter hypodensity in the left hepatic dome,
too small to further characterize. Calcified granulomas.

Pancreas: Unremarkable. No pancreatic ductal dilatation or
surrounding inflammatory changes.

Spleen: Normal in size without focal abnormality.

Adrenals/Urinary Tract: Adrenal glands are normal. Cyst mid left
kidney. Mild to moderate left hydronephrosis and proximal
hydroureter secondary to a 5 x 6 mm stone in the proximal left
ureter about 2.5 cm distal to the left UPJ. Punctate intrarenal
stones within both kidneys. Bladder is empty

Stomach/Bowel: Stomach is within normal limits. Appendix appears
normal. No evidence of bowel wall thickening, distention, or
inflammatory changes. Sigmoid colon diverticula without acute
inflammatory change.

Vascular/Lymphatic: Moderate aortic atherosclerosis. No aneurysm. No
significantly enlarged lymph nodes.

Reproductive: Postsurgical changes at the prostate.

Other: No free air or free fluid. Left inguinal hernia contains fat,
the upper sac contains a small portion of sigmoid colon and
diverticula.

Musculoskeletal: No acute or suspicious osseous abnormality.
IMPRESSION: 1. Mild to moderate left hydronephrosis and proximal hydroureter,
secondary to a 5 x 6 mm stone in the proximal left ureter about
cm distal to the left UPJ
2. Punctate stones within the kidneys
3. Sigmoid colon diverticular disease without acute inflammatory
change
4. Left inguinal hernia containing and small amount of sigmoid colon
